# Patient Record
Sex: Male | Born: 1950 | Race: White | Hispanic: No | Marital: Single
Health system: Southern US, Community
[De-identification: ages and names within clinical notes are randomized; demographics above are authoritative.]

---

## 2009-12-23 ENCOUNTER — Ambulatory Visit: Payer: Self-pay | Admitting: Infectious Diseases

## 2009-12-23 ENCOUNTER — Inpatient Hospital Stay (HOSPITAL_COMMUNITY): Admission: EM | Admit: 2009-12-23 | Discharge: 2009-12-30 | Payer: Self-pay | Admitting: Emergency Medicine

## 2009-12-27 ENCOUNTER — Ambulatory Visit: Payer: Self-pay | Admitting: Vascular Surgery

## 2009-12-27 ENCOUNTER — Ambulatory Visit: Payer: Self-pay | Admitting: Physical Medicine & Rehabilitation

## 2009-12-27 ENCOUNTER — Encounter: Payer: Self-pay | Admitting: Internal Medicine

## 2009-12-27 DIAGNOSIS — I10 Essential (primary) hypertension: Secondary | ICD-10-CM | POA: Insufficient documentation

## 2009-12-27 DIAGNOSIS — E119 Type 2 diabetes mellitus without complications: Secondary | ICD-10-CM | POA: Insufficient documentation

## 2009-12-27 DIAGNOSIS — E785 Hyperlipidemia, unspecified: Secondary | ICD-10-CM | POA: Insufficient documentation

## 2009-12-27 DIAGNOSIS — I6350 Cerebral infarction due to unspecified occlusion or stenosis of unspecified cerebral artery: Secondary | ICD-10-CM | POA: Insufficient documentation

## 2009-12-30 ENCOUNTER — Ambulatory Visit: Payer: Self-pay | Admitting: Physical Medicine & Rehabilitation

## 2009-12-30 ENCOUNTER — Inpatient Hospital Stay (HOSPITAL_COMMUNITY)
Admission: RE | Admit: 2009-12-30 | Discharge: 2010-01-10 | Payer: Self-pay | Admitting: Physical Medicine & Rehabilitation

## 2010-01-23 ENCOUNTER — Encounter (INDEPENDENT_AMBULATORY_CARE_PROVIDER_SITE_OTHER): Payer: Self-pay | Admitting: *Deleted

## 2010-01-23 LAB — CONVERTED CEMR LAB
Cholesterol: 84 mg/dL (ref 0–200)
LDL Cholesterol: 19 mg/dL (ref 0–99)
Total CHOL/HDL Ratio: 2.6
Triglycerides: 164 mg/dL — ABNORMAL HIGH (ref <150)
VLDL: 33 mg/dL (ref 0–40)

## 2010-01-30 ENCOUNTER — Encounter
Admission: RE | Admit: 2010-01-30 | Discharge: 2010-02-03 | Payer: Self-pay | Source: Home / Self Care | Attending: Physical Medicine & Rehabilitation | Admitting: Physical Medicine & Rehabilitation

## 2010-02-03 ENCOUNTER — Ambulatory Visit: Payer: Self-pay | Admitting: Physical Medicine & Rehabilitation

## 2010-02-20 ENCOUNTER — Emergency Department (HOSPITAL_COMMUNITY)
Admission: EM | Admit: 2010-02-20 | Discharge: 2010-02-20 | Payer: Self-pay | Source: Home / Self Care | Admitting: Emergency Medicine

## 2010-04-04 ENCOUNTER — Ambulatory Visit: Payer: Self-pay | Admitting: Physical Medicine & Rehabilitation

## 2010-05-17 ENCOUNTER — Encounter (INDEPENDENT_AMBULATORY_CARE_PROVIDER_SITE_OTHER): Payer: Self-pay | Admitting: *Deleted

## 2010-05-17 LAB — CONVERTED CEMR LAB
ALT: 12 units/L (ref 0–53)
AST: 17 units/L (ref 0–37)
Albumin: 4 g/dL (ref 3.5–5.2)
Alkaline Phosphatase: 71 units/L (ref 39–117)
Basophils Relative: 1 % (ref 0–1)
Chloride: 109 meq/L (ref 96–112)
Eosinophils Absolute: 0.6 10*3/uL (ref 0.0–0.7)
MCHC: 32.4 g/dL (ref 30.0–36.0)
MCV: 90.9 fL (ref 78.0–100.0)
Neutro Abs: 6.8 10*3/uL (ref 1.7–7.7)
Neutrophils Relative %: 55 % (ref 43–77)
Platelets: 248 10*3/uL (ref 150–400)
Potassium: 3.8 meq/L (ref 3.5–5.3)
Sodium: 141 meq/L (ref 135–145)
Total Protein: 7.4 g/dL (ref 6.0–8.3)
WBC: 12.4 10*3/uL — ABNORMAL HIGH (ref 4.0–10.5)

## 2010-05-23 NOTE — Discharge Summary (Signed)
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) - Signed Added new problem of HYPERTENSION (ICD-401.9) - Signed Added new problem of HYPERLIPIDEMIA (ICD-272.4) - Signed Added new problem of DIABETES MELLITUS, TYPE II (ICD-250.00) - Signed  Medication list changes:  Added new medication of ASPIRIN 81 MG TBEC (ASPIRIN) Take 1 tablet by mouth once a day - Signed Added new medication of PLAVIX 75 MG TABS (CLOPIDOGREL BISULFATE) Take 1 tablet by mouth once a day - Signed Added new medication of LISINOPRIL-HYDROCHLOROTHIAZIDE 20-25 MG TABS (LISINOPRIL-HYDROCHLOROTHIAZIDE) Take 1 tablet by mouth once a day - Signed Added new medication of PRAVASTATIN SODIUM 40 MG TABS (PRAVASTATIN SODIUM) Take 1 tablet by mouth once a day - Signed Added new medication of NORVASC 10 MG TABS (AMLODIPINE BESYLATE) Take 1 tablet by mouth once a day - Signed Added new medication of METFORMIN HCL 500 MG TABS (METFORMIN HCL) Take 1 tablet by mouth once a day for a week then increase it to  1 tablet by mouth two times a day - Signed  The medication, problem, and allergy lists have been updated.  Please see the dictated discharge summary for details.  Discharge medications:  ASPIRIN 81 MG TBEC (ASPIRIN) Take 1 tablet by mouth once a day PLAVIX 75 MG TABS (CLOPIDOGREL BISULFATE) Take 1 tablet by mouth once a day LISINOPRIL-HYDROCHLOROTHIAZIDE 20-25 MG TABS (LISINOPRIL-HYDROCHLOROTHIAZIDE) Take 1 tablet by mouth once a day PRAVASTATIN SODIUM 40 MG TABS  (PRAVASTATIN SODIUM) Take 1 tablet by mouth once a day NORVASC 10 MG TABS (AMLODIPINE BESYLATE) Take 1 tablet by mouth once a day METFORMIN HCL 500 MG TABS (METFORMIN HCL) Take 1 tablet by mouth once a day for a week then increase it to  1 tablet by mouth two times a day  Other patient instructions:  Follow up with [facility] on 01/23/10 at 8:30 pm. Call [phone] if ypu have a question or concerns.

## 2010-06-20 ENCOUNTER — Encounter (INDEPENDENT_AMBULATORY_CARE_PROVIDER_SITE_OTHER): Payer: Self-pay | Admitting: *Deleted

## 2010-06-20 LAB — CONVERTED CEMR LAB
BUN: 27 mg/dL — ABNORMAL HIGH (ref 6–23)
Calcium: 9.1 mg/dL (ref 8.4–10.5)
Creatinine, Ser: 1.31 mg/dL (ref 0.40–1.50)
Glucose, Bld: 145 mg/dL — ABNORMAL HIGH (ref 70–99)

## 2010-07-05 LAB — DIFFERENTIAL
Basophils Relative: 1 % (ref 0–1)
Lymphocytes Relative: 20 % (ref 12–46)
Monocytes Absolute: 1.2 10*3/uL — ABNORMAL HIGH (ref 0.1–1.0)
Monocytes Relative: 9 % (ref 3–12)
Neutro Abs: 9.3 10*3/uL — ABNORMAL HIGH (ref 1.7–7.7)

## 2010-07-05 LAB — COMPREHENSIVE METABOLIC PANEL
Albumin: 3.3 g/dL — ABNORMAL LOW (ref 3.5–5.2)
Alkaline Phosphatase: 68 U/L (ref 39–117)
BUN: 15 mg/dL (ref 6–23)
GFR calc Af Amer: >60 mL/min (ref >60)
Potassium: 3.2 mEq/L — ABNORMAL LOW (ref 3.5–5.1)
Total Protein: 6.9 g/dL (ref 6.0–8.3)

## 2010-07-05 LAB — CBC
MCV: 80.3 fL (ref 78.0–100.0)
Platelets: 215 10*3/uL (ref 150–400)
RDW: 12.9 % (ref 11.5–15.5)
WBC: 13.5 10*3/uL — ABNORMAL HIGH (ref 4.0–10.5)

## 2010-07-05 LAB — POCT I-STAT, CHEM 8
BUN: 16 mg/dL (ref 6–23)
Calcium, Ion: 1.06 mmol/L — ABNORMAL LOW (ref 1.12–1.32)
Chloride: 95 mEq/L — ABNORMAL LOW (ref 96–112)
Glucose, Bld: 272 mg/dL — ABNORMAL HIGH (ref 70–99)

## 2010-07-06 LAB — URINALYSIS, ROUTINE W REFLEX MICROSCOPIC
Bilirubin Urine: NEGATIVE
Leukocytes, UA: NEGATIVE
Nitrite: NEGATIVE
Specific Gravity, Urine: 1.025 (ref 1.005–1.030)
Urobilinogen, UA: 0.2 mg/dL (ref 0.0–1.0)
pH: 7 (ref 5.0–8.0)

## 2010-07-06 LAB — GLUCOSE, CAPILLARY
Glucose-Capillary: 110 mg/dL — ABNORMAL HIGH (ref 70–99)
Glucose-Capillary: 112 mg/dL — ABNORMAL HIGH (ref 70–99)
Glucose-Capillary: 117 mg/dL — ABNORMAL HIGH (ref 70–99)
Glucose-Capillary: 123 mg/dL — ABNORMAL HIGH (ref 70–99)
Glucose-Capillary: 158 mg/dL — ABNORMAL HIGH (ref 70–99)
Glucose-Capillary: 171 mg/dL — ABNORMAL HIGH (ref 70–99)
Glucose-Capillary: 174 mg/dL — ABNORMAL HIGH (ref 70–99)
Glucose-Capillary: 176 mg/dL — ABNORMAL HIGH (ref 70–99)
Glucose-Capillary: 187 mg/dL — ABNORMAL HIGH (ref 70–99)
Glucose-Capillary: 218 mg/dL — ABNORMAL HIGH (ref 70–99)
Glucose-Capillary: 140 mg/dL — ABNORMAL HIGH (ref 70–99)
Glucose-Capillary: 142 mg/dL — ABNORMAL HIGH (ref 70–99)
Glucose-Capillary: 143 mg/dL — ABNORMAL HIGH (ref 70–99)
Glucose-Capillary: 155 mg/dL — ABNORMAL HIGH (ref 70–99)
Glucose-Capillary: 170 mg/dL — ABNORMAL HIGH (ref 70–99)
Glucose-Capillary: 172 mg/dL — ABNORMAL HIGH (ref 70–99)
Glucose-Capillary: 174 mg/dL — ABNORMAL HIGH (ref 70–99)
Glucose-Capillary: 178 mg/dL — ABNORMAL HIGH (ref 70–99)
Glucose-Capillary: 190 mg/dL — ABNORMAL HIGH (ref 70–99)
Glucose-Capillary: 196 mg/dL — ABNORMAL HIGH (ref 70–99)
Glucose-Capillary: 203 mg/dL — ABNORMAL HIGH (ref 70–99)
Glucose-Capillary: 205 mg/dL — ABNORMAL HIGH (ref 70–99)
Glucose-Capillary: 210 mg/dL — ABNORMAL HIGH (ref 70–99)
Glucose-Capillary: 222 mg/dL — ABNORMAL HIGH (ref 70–99)
Glucose-Capillary: 228 mg/dL — ABNORMAL HIGH (ref 70–99)
Glucose-Capillary: 242 mg/dL — ABNORMAL HIGH (ref 70–99)
Glucose-Capillary: 261 mg/dL — ABNORMAL HIGH (ref 70–99)
Glucose-Capillary: 270 mg/dL — ABNORMAL HIGH (ref 70–99)
Glucose-Capillary: 272 mg/dL — ABNORMAL HIGH (ref 70–99)
Glucose-Capillary: 337 mg/dL — ABNORMAL HIGH (ref 70–99)

## 2010-07-06 LAB — CBC
HCT: 36.1 % — ABNORMAL LOW (ref 39.0–52.0)
Hemoglobin: 12.5 g/dL — ABNORMAL LOW (ref 13.0–17.0)
Hemoglobin: 12.8 g/dL — ABNORMAL LOW (ref 13.0–17.0)
Hemoglobin: 13.3 g/dL (ref 13.0–17.0)
Hemoglobin: 14.3 g/dL (ref 13.0–17.0)
Hemoglobin: 14.4 g/dL (ref 13.0–17.0)
MCH: 28.8 pg (ref 26.0–34.0)
MCH: 29.3 pg (ref 26.0–34.0)
MCH: 29.3 pg (ref 26.0–34.0)
MCH: 29.8 pg (ref 26.0–34.0)
MCH: 29.8 pg (ref 26.0–34.0)
MCHC: 35.1 g/dL (ref 30.0–36.0)
MCHC: 35.1 g/dL (ref 30.0–36.0)
MCHC: 35.2 g/dL (ref 30.0–36.0)
MCHC: 35.8 g/dL (ref 30.0–36.0)
MCV: 83.1 fL (ref 78.0–100.0)
MCV: 83.2 fL (ref 78.0–100.0)
MCV: 83.6 fL (ref 78.0–100.0)
MCV: 83.6 fL (ref 78.0–100.0)
MCV: 84.3 fL (ref 78.0–100.0)
Platelets: 172 10*3/uL (ref 150–400)
Platelets: 177 10*3/uL (ref 150–400)
Platelets: 180 10*3/uL (ref 150–400)
Platelets: 190 10*3/uL (ref 150–400)
Platelets: 194 10*3/uL (ref 150–400)
RBC: 4.25 MIL/uL (ref 4.22–5.81)
RBC: 4.45 MIL/uL (ref 4.22–5.81)
RBC: 4.46 MIL/uL (ref 4.22–5.81)
RBC: 4.83 MIL/uL (ref 4.22–5.81)
RDW: 12.9 % (ref 11.5–15.5)
RDW: 13.1 % (ref 11.5–15.5)
RDW: 13.2 % (ref 11.5–15.5)
WBC: 11.4 10*3/uL — ABNORMAL HIGH (ref 4.0–10.5)
WBC: 12 10*3/uL — ABNORMAL HIGH (ref 4.0–10.5)
WBC: 9.6 10*3/uL (ref 4.0–10.5)

## 2010-07-06 LAB — LIPID PANEL
HDL: 38 mg/dL — ABNORMAL LOW (ref >39)
HDL: 46 mg/dL (ref >39)
LDL Cholesterol: 105 mg/dL — ABNORMAL HIGH (ref 0–99)
Triglycerides: 332 mg/dL — ABNORMAL HIGH (ref <150)

## 2010-07-06 LAB — BASIC METABOLIC PANEL
BUN: 11 mg/dL (ref 6–23)
CO2: 25 mEq/L (ref 19–32)
CO2: 25 mEq/L (ref 19–32)
CO2: 25 mEq/L (ref 19–32)
CO2: 27 mEq/L (ref 19–32)
Calcium: 8.3 mg/dL — ABNORMAL LOW (ref 8.4–10.5)
Calcium: 8.3 mg/dL — ABNORMAL LOW (ref 8.4–10.5)
Calcium: 9.1 mg/dL (ref 8.4–10.5)
Chloride: 105 mEq/L (ref 96–112)
Chloride: 107 mEq/L (ref 96–112)
Chloride: 107 mEq/L (ref 96–112)
Creatinine, Ser: 1.06 mg/dL (ref 0.4–1.5)
Creatinine, Ser: 1.06 mg/dL (ref 0.4–1.5)
Creatinine, Ser: 1.12 mg/dL (ref 0.4–1.5)
Creatinine, Ser: 1.21 mg/dL (ref 0.4–1.5)
GFR calc Af Amer: >60 mL/min (ref >60)
GFR calc Af Amer: >60 mL/min (ref >60)
GFR calc Af Amer: >60 mL/min (ref >60)
GFR calc Af Amer: >60 mL/min (ref >60)
GFR calc non Af Amer: 59 mL/min — ABNORMAL LOW (ref >60)
GFR calc non Af Amer: >60 mL/min (ref >60)
Glucose, Bld: 154 mg/dL — ABNORMAL HIGH (ref 70–99)
Glucose, Bld: 173 mg/dL — ABNORMAL HIGH (ref 70–99)
Potassium: 3.8 mEq/L (ref 3.5–5.1)
Sodium: 137 mEq/L (ref 135–145)
Sodium: 139 mEq/L (ref 135–145)
Sodium: 139 mEq/L (ref 135–145)

## 2010-07-06 LAB — PROTIME-INR
INR: 0.9 (ref 0.00–1.49)
Prothrombin Time: 12.4 seconds (ref 11.6–15.2)
Prothrombin Time: 13.4 seconds (ref 11.6–15.2)

## 2010-07-06 LAB — CARDIAC PANEL(CRET KIN+CKTOT+MB+TROPI)
CK, MB: 1.8 ng/mL (ref 0.3–4.0)
Relative Index: INVALID (ref 0.0–2.5)
Total CK: 104 U/L (ref 7–232)
Total CK: 72 U/L (ref 7–232)
Total CK: 81 U/L (ref 7–232)
Troponin I: 0.04 ng/mL (ref 0.00–0.06)

## 2010-07-06 LAB — DIFFERENTIAL
Basophils Absolute: 0.1 10*3/uL (ref 0.0–0.1)
Basophils Relative: 1 % (ref 0–1)
Basophils Relative: 1 % (ref 0–1)
Eosinophils Absolute: 0 10*3/uL (ref 0.0–0.7)
Eosinophils Absolute: 0.5 10*3/uL (ref 0.0–0.7)
Eosinophils Relative: 0 % (ref 0–5)
Eosinophils Relative: 4 % (ref 0–5)
Lymphs Abs: 1.5 10*3/uL (ref 0.7–4.0)
Monocytes Absolute: 1.4 10*3/uL — ABNORMAL HIGH (ref 0.1–1.0)
Monocytes Relative: 13 % — ABNORMAL HIGH (ref 3–12)
Neutrophils Relative %: 50 % (ref 43–77)

## 2010-07-06 LAB — CK TOTAL AND CKMB (NOT AT ARMC)
Relative Index: INVALID (ref 0.0–2.5)
Total CK: 91 U/L (ref 7–232)

## 2010-07-06 LAB — COMPREHENSIVE METABOLIC PANEL
ALT: 17 U/L (ref 0–53)
ALT: 19 U/L (ref 0–53)
AST: 24 U/L (ref 0–37)
Albumin: 2.9 g/dL — ABNORMAL LOW (ref 3.5–5.2)
Albumin: 3.1 g/dL — ABNORMAL LOW (ref 3.5–5.2)
Alkaline Phosphatase: 62 U/L (ref 39–117)
Alkaline Phosphatase: 62 U/L (ref 39–117)
BUN: 13 mg/dL (ref 6–23)
CO2: 26 mEq/L (ref 19–32)
Calcium: 8.6 mg/dL (ref 8.4–10.5)
Calcium: 8.6 mg/dL (ref 8.4–10.5)
Chloride: 98 mEq/L (ref 96–112)
Creatinine, Ser: 1.07 mg/dL (ref 0.4–1.5)
GFR calc Af Amer: >60 mL/min (ref >60)
GFR calc non Af Amer: 43 mL/min — ABNORMAL LOW (ref >60)
Glucose, Bld: 162 mg/dL — ABNORMAL HIGH (ref 70–99)
Potassium: 3.3 mEq/L — ABNORMAL LOW (ref 3.5–5.1)
Potassium: 3.7 mEq/L (ref 3.5–5.1)
Sodium: 131 mEq/L — ABNORMAL LOW (ref 135–145)
Sodium: 135 mEq/L (ref 135–145)
Sodium: 139 mEq/L (ref 135–145)
Total Bilirubin: 0.4 mg/dL (ref 0.3–1.2)
Total Protein: 6.3 g/dL (ref 6.0–8.3)

## 2010-07-06 LAB — RAPID URINE DRUG SCREEN, HOSP PERFORMED
Benzodiazepines: NOT DETECTED
Cocaine: NOT DETECTED
Tetrahydrocannabinol: NOT DETECTED

## 2010-07-06 LAB — HEMOGLOBIN A1C
Hgb A1c MFr Bld: 9.9 % — ABNORMAL HIGH (ref <5.7)
Mean Plasma Glucose: 237 mg/dL — ABNORMAL HIGH (ref <117)

## 2010-07-06 LAB — POCT CARDIAC MARKERS
CKMB, poc: 1.3 ng/mL (ref 1.0–8.0)
Myoglobin, poc: 84.3 ng/mL (ref 12–200)

## 2010-07-06 LAB — MAGNESIUM: Magnesium: 1.9 mg/dL (ref 1.5–2.5)

## 2010-07-06 LAB — TSH: TSH: 1.089 u[IU]/mL (ref 0.350–4.500)

## 2010-07-06 LAB — URINE MICROSCOPIC-ADD ON

## 2010-09-20 ENCOUNTER — Other Ambulatory Visit (HOSPITAL_COMMUNITY): Payer: Self-pay | Admitting: Family Medicine

## 2010-09-20 ENCOUNTER — Other Ambulatory Visit: Payer: Self-pay | Admitting: Internal Medicine

## 2010-09-20 DIAGNOSIS — M75100 Unspecified rotator cuff tear or rupture of unspecified shoulder, not specified as traumatic: Secondary | ICD-10-CM

## 2010-09-22 ENCOUNTER — Ambulatory Visit (HOSPITAL_COMMUNITY)
Admission: RE | Admit: 2010-09-22 | Discharge: 2010-09-22 | Disposition: A | Discharge status: Home / Self Care | Payer: Self-pay | Source: Ambulatory Visit | Attending: Family Medicine | Admitting: Family Medicine

## 2010-09-22 DIAGNOSIS — M719 Bursopathy, unspecified: Secondary | ICD-10-CM | POA: Insufficient documentation

## 2010-09-22 DIAGNOSIS — M67919 Unspecified disorder of synovium and tendon, unspecified shoulder: Secondary | ICD-10-CM | POA: Insufficient documentation

## 2010-09-22 DIAGNOSIS — M25519 Pain in unspecified shoulder: Secondary | ICD-10-CM | POA: Insufficient documentation

## 2010-09-22 DIAGNOSIS — M19019 Primary osteoarthritis, unspecified shoulder: Secondary | ICD-10-CM | POA: Insufficient documentation

## 2010-09-22 DIAGNOSIS — M75100 Unspecified rotator cuff tear or rupture of unspecified shoulder, not specified as traumatic: Secondary | ICD-10-CM

## 2011-04-26 ENCOUNTER — Emergency Department: Payer: Self-pay | Admitting: Emergency Medicine

## 2011-04-26 LAB — CBC
HGB: 13.5 g/dL (ref 13.0–18.0)
MCH: 29.8 pg (ref 26.0–34.0)
MCHC: 34.6 g/dL (ref 32.0–36.0)
Platelet: 248 10*3/uL (ref 150–440)
RBC: 4.54 10*6/uL (ref 4.40–5.90)
RDW: 12.4 % (ref 11.5–14.5)

## 2011-04-26 LAB — COMPREHENSIVE METABOLIC PANEL
Albumin: 3.4 g/dL (ref 3.4–5.0)
Alkaline Phosphatase: 76 U/L (ref 50–136)
Calcium, Total: 9.3 mg/dL (ref 8.5–10.1)
Co2: 28 mmol/L (ref 21–32)
Creatinine: 1.47 mg/dL — ABNORMAL HIGH (ref 0.60–1.30)
EGFR (Non-African Amer.): 52 — ABNORMAL LOW
Glucose: 209 mg/dL — ABNORMAL HIGH (ref 65–99)
Potassium: 3.3 mmol/L — ABNORMAL LOW (ref 3.5–5.1)
Sodium: 145 mmol/L (ref 136–145)

## 2011-04-26 LAB — LIPASE, BLOOD: Lipase: 85 U/L (ref 73–393)

## 2011-10-20 IMAGING — CT CT HEAD W/O CM
1 series · 16 of 30 positions shown, 20 images · non-contrast
Comparison: None.

CLINICAL DATA: Right-sided weakness and diaphoresis.

CT HEAD WITHOUT CONTRAST
TECHNIQUE: Contiguous axial images were obtained from the base of
the skull through the vertex without contrast.

[Series 2: head routine 4.8 h37s · axial · 0.47mm/px · z∈[-107,+51]mm · 16 of 36 slices shown, 20 images]
[im 2/36  brain]
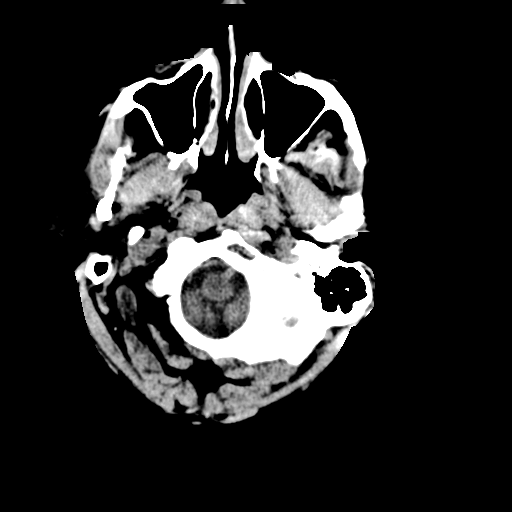
[im 2/36  bone]
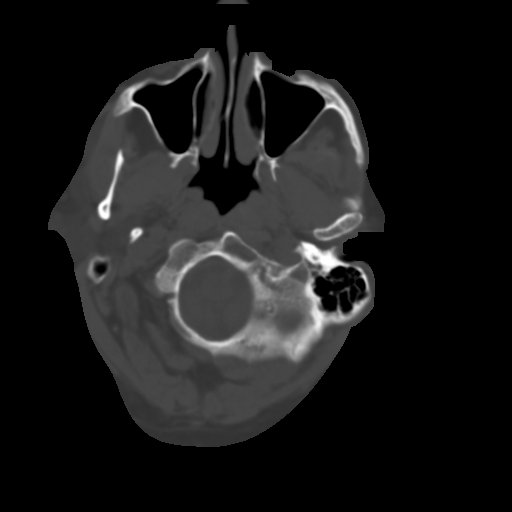
[im 4/36  brain]
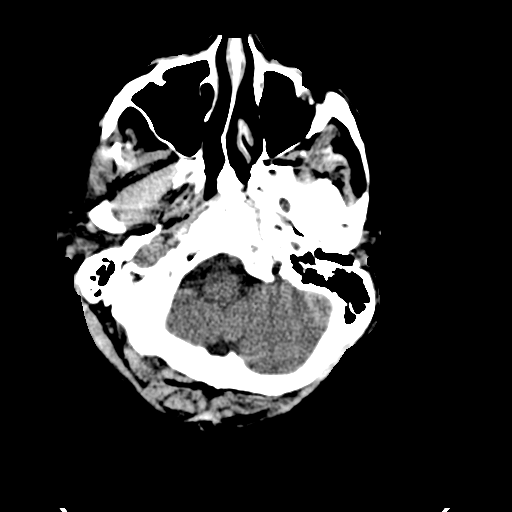
[im 7/36  brain]
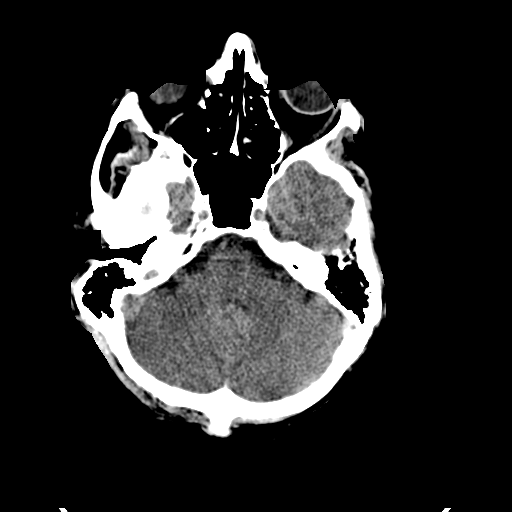
[im 9/36  brain]
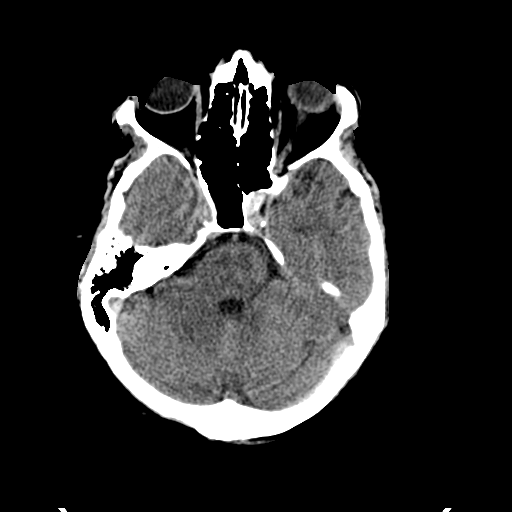
[im 10/36  brain]
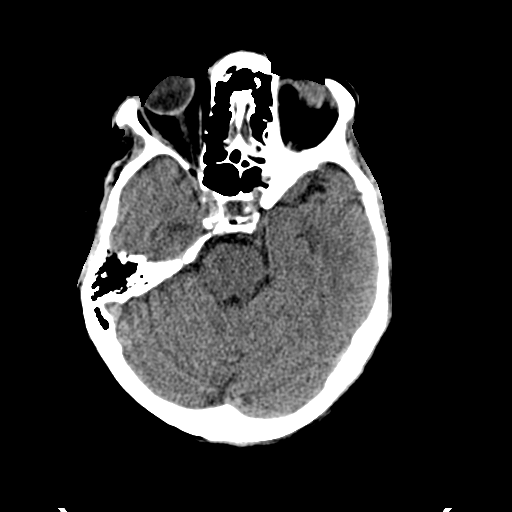
[im 10/36  bone]
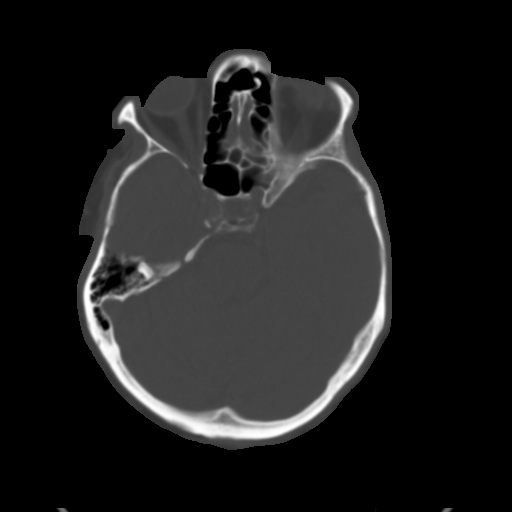
[im 13/36  brain]
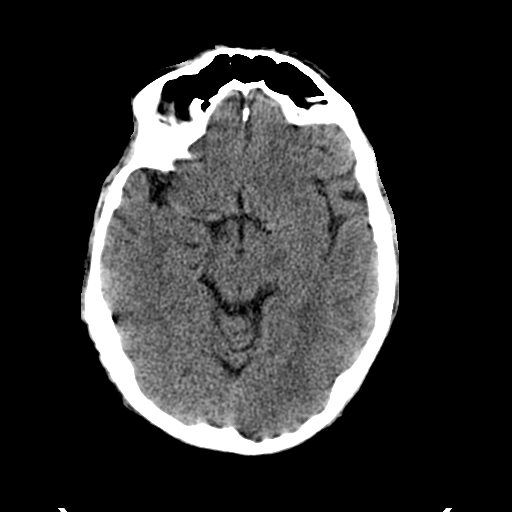
[im 15/36  brain]
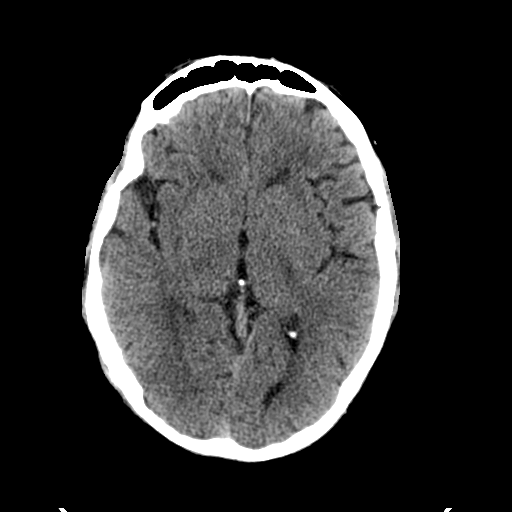
[im 17/36  brain]
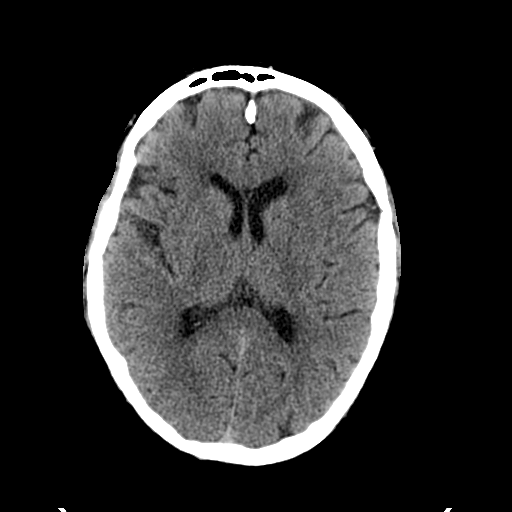
[im 19/36  brain]
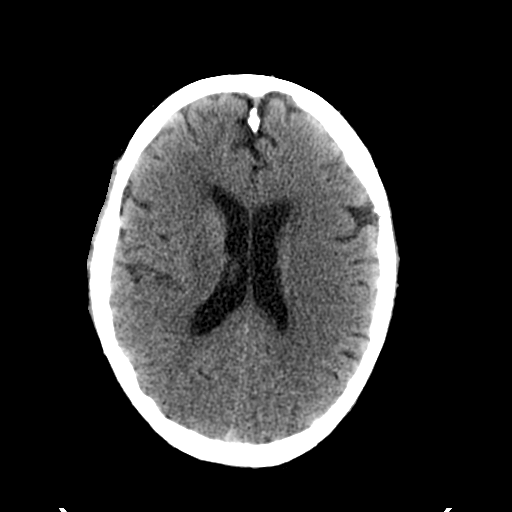
[im 19/36  bone]
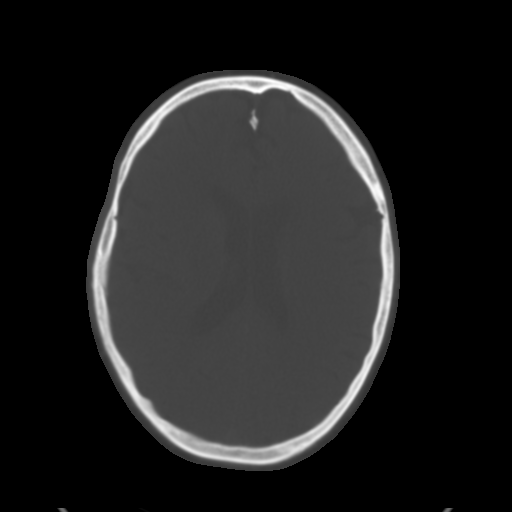
[im 21/36  brain]
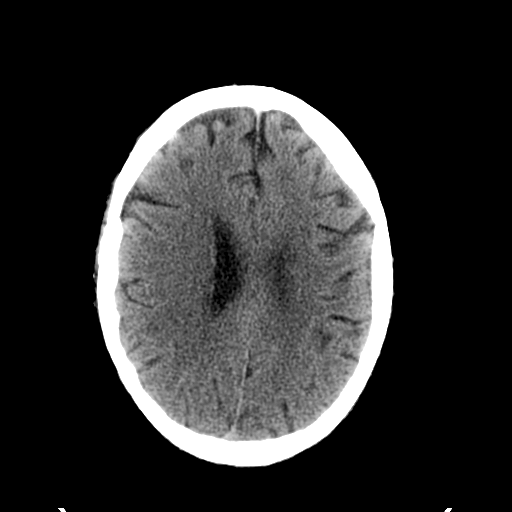
[im 23/36  brain]
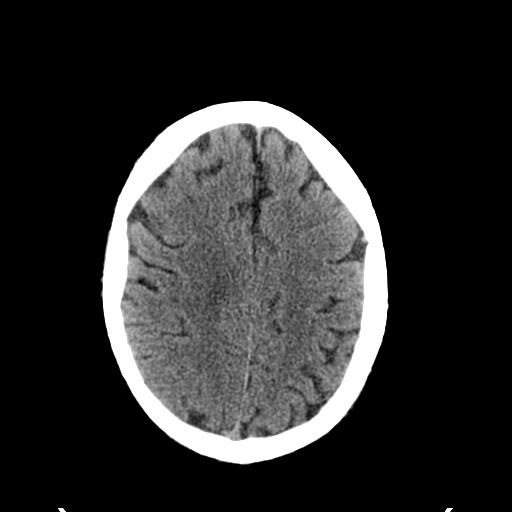
[im 26/36  brain]
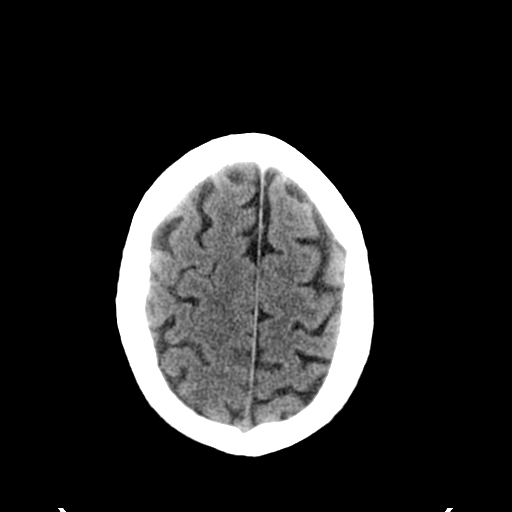
[im 27/36  brain]
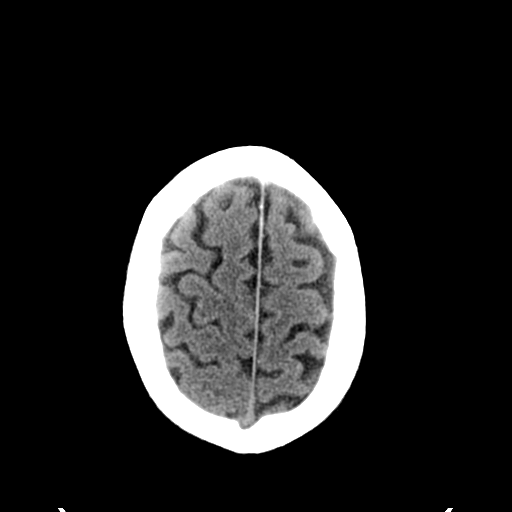
[im 27/36  bone]
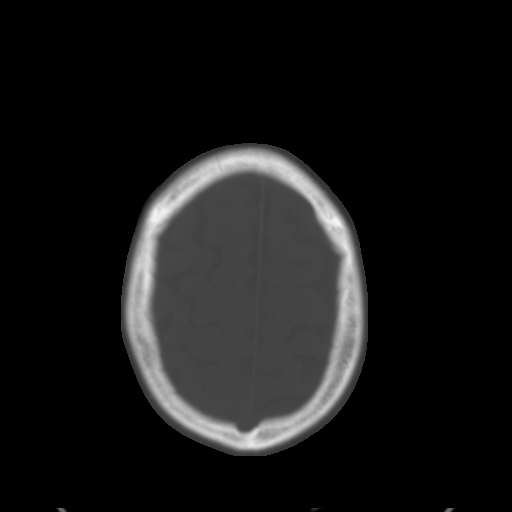
[im 29/36  brain]
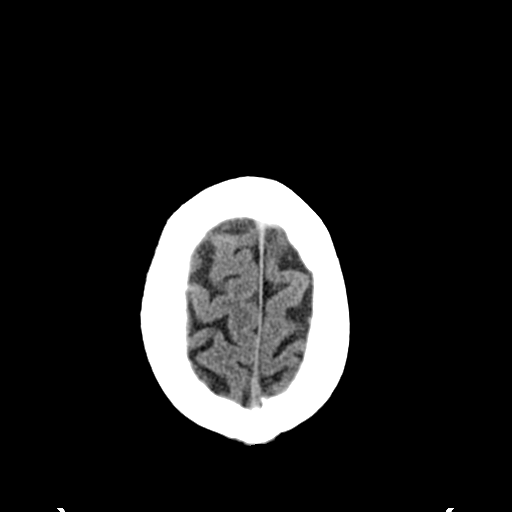
[im 32/36  brain]
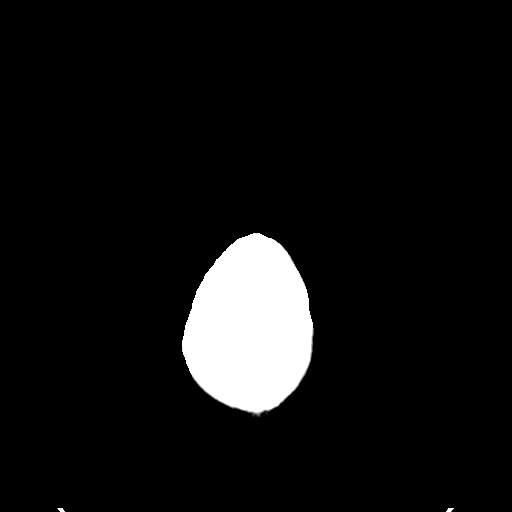
[im 34/36  brain]
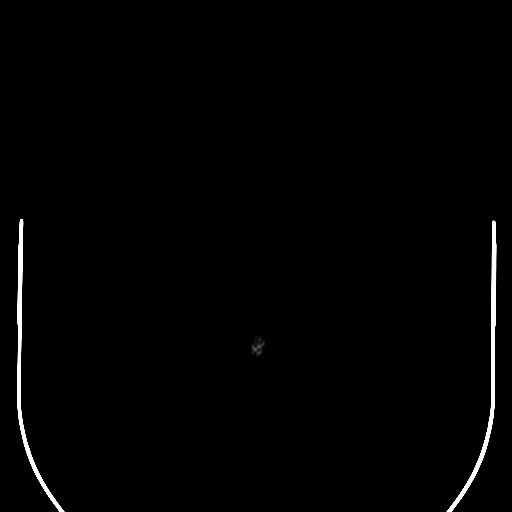

[16 of 30 positions shown; findings below may reference images not displayed]

FINDINGS: There are no midline shifts or mass lesions.  The
ventricles are normal in size and contour.  There is mild cerebral
atrophy.  There is no CT scan evidence for acute hemorrhage or
stroke.  There are no extra-axial fluid collections.  Bone windows
settings are negative.
IMPRESSION: Mild cerebral atrophy.  No acute intracranial findings.

## 2012-02-15 ENCOUNTER — Inpatient Hospital Stay: Payer: Self-pay | Admitting: Specialist

## 2012-02-15 LAB — CK TOTAL AND CKMB (NOT AT ARMC)
CK, Total: 89 U/L (ref 35–232)
CK-MB: 1.3 ng/mL (ref 0.5–3.6)
CK-MB: 1.3 ng/mL (ref 0.5–3.6)

## 2012-02-15 LAB — TROPONIN I
Troponin-I: 0.08 ng/mL — ABNORMAL HIGH
Troponin-I: 0.07 ng/mL — ABNORMAL HIGH

## 2012-02-15 LAB — CBC
HGB: 15.4 g/dL (ref 13.0–18.0)
MCHC: 34.4 g/dL (ref 32.0–36.0)
Platelet: 259 10*3/uL (ref 150–440)
RDW: 13.5 % (ref 11.5–14.5)

## 2012-02-15 LAB — COMPREHENSIVE METABOLIC PANEL
Anion Gap: 14 (ref 7–16)
BUN: 28 mg/dL — ABNORMAL HIGH (ref 7–18)
Bilirubin,Total: 0.9 mg/dL (ref 0.2–1.0)
Chloride: 95 mmol/L — ABNORMAL LOW (ref 98–107)
Co2: 23 mmol/L (ref 21–32)
Creatinine: 1.94 mg/dL — ABNORMAL HIGH (ref 0.60–1.30)
EGFR (African American): 43 — ABNORMAL LOW
EGFR (Non-African Amer.): 37 — ABNORMAL LOW
Potassium: 3.1 mmol/L — ABNORMAL LOW (ref 3.5–5.1)
SGOT(AST): 27 U/L (ref 15–37)
SGPT (ALT): 19 U/L (ref 12–78)
Total Protein: 8 g/dL (ref 6.4–8.2)

## 2012-02-15 LAB — LIPID PANEL
Cholesterol: 317 mg/dL — ABNORMAL HIGH (ref 0–200)
HDL Cholesterol: 41 mg/dL (ref 40–60)
Ldl Cholesterol, Calc: 206 mg/dL — ABNORMAL HIGH (ref 0–100)
Triglycerides: 349 mg/dL — ABNORMAL HIGH (ref 0–200)
VLDL Cholesterol, Calc: 70 mg/dL — ABNORMAL HIGH (ref 5–40)

## 2012-02-15 LAB — HEMOGLOBIN A1C: Hemoglobin A1C: 12.3 % — ABNORMAL HIGH (ref 4.2–6.3)

## 2012-02-16 LAB — CBC WITH DIFFERENTIAL/PLATELET
Basophil #: 0.1 10*3/uL (ref 0.0–0.1)
Basophil %: 0.5 %
Eosinophil #: 0.1 10*3/uL (ref 0.0–0.7)
HCT: 35.3 % — ABNORMAL LOW (ref 40.0–52.0)
HGB: 12.3 g/dL — ABNORMAL LOW (ref 13.0–18.0)
Lymphocyte %: 15.8 %
MCH: 28.8 pg (ref 26.0–34.0)
MCV: 83 fL (ref 80–100)
Monocyte #: 1.3 x10 3/mm — ABNORMAL HIGH (ref 0.2–1.0)
Monocyte %: 9.1 %
Neutrophil #: 10.5 10*3/uL — ABNORMAL HIGH (ref 1.4–6.5)
Neutrophil %: 74.1 %
RBC: 4.27 10*6/uL — ABNORMAL LOW (ref 4.40–5.90)
RDW: 13.6 % (ref 11.5–14.5)

## 2012-02-16 LAB — URINALYSIS, COMPLETE
Glucose,UR: >=500 mg/dL (ref 0–75)
Granular Cast: 3
Nitrite: NEGATIVE
Protein: >=500
Specific Gravity: 1.02 (ref 1.003–1.030)

## 2012-02-16 LAB — BASIC METABOLIC PANEL
Anion Gap: 13 (ref 7–16)
Co2: 23 mmol/L (ref 21–32)
Creatinine: 2.34 mg/dL — ABNORMAL HIGH (ref 0.60–1.30)
EGFR (African American): 34 — ABNORMAL LOW
EGFR (Non-African Amer.): 30 — ABNORMAL LOW
Sodium: 136 mmol/L (ref 136–145)

## 2012-02-16 LAB — CK TOTAL AND CKMB (NOT AT ARMC)
CK, Total: 76 U/L (ref 35–232)
CK-MB: 1.3 ng/mL (ref 0.5–3.6)

## 2012-02-17 DIAGNOSIS — I059 Rheumatic mitral valve disease, unspecified: Secondary | ICD-10-CM

## 2012-02-17 LAB — CBC WITH DIFFERENTIAL/PLATELET
Basophil %: 0.4 %
Eosinophil #: 0.4 10*3/uL (ref 0.0–0.7)
Eosinophil %: 3.3 %
HCT: 31.9 % — ABNORMAL LOW (ref 40.0–52.0)
HGB: 11.1 g/dL — ABNORMAL LOW (ref 13.0–18.0)
Lymphocyte #: 3 10*3/uL (ref 1.0–3.6)
MCH: 28.8 pg (ref 26.0–34.0)
MCV: 83 fL (ref 80–100)
Monocyte #: 1.2 x10 3/mm — ABNORMAL HIGH (ref 0.2–1.0)
Monocyte %: 9.6 %
Neutrophil %: 63.9 %
Platelet: 188 10*3/uL (ref 150–440)
RBC: 3.86 10*6/uL — ABNORMAL LOW (ref 4.40–5.90)

## 2012-02-17 LAB — PHOSPHORUS: Phosphorus: 2.9 mg/dL (ref 2.5–4.9)

## 2012-02-17 LAB — BASIC METABOLIC PANEL
Anion Gap: 10 (ref 7–16)
Chloride: 105 mmol/L (ref 98–107)
Co2: 23 mmol/L (ref 21–32)
Creatinine: 2.46 mg/dL — ABNORMAL HIGH (ref 0.60–1.30)
EGFR (Non-African Amer.): 28 — ABNORMAL LOW
Glucose: 159 mg/dL — ABNORMAL HIGH (ref 65–99)
Osmolality: 287 (ref 275–301)
Potassium: 3.3 mmol/L — ABNORMAL LOW (ref 3.5–5.1)
Sodium: 138 mmol/L (ref 136–145)

## 2012-02-17 LAB — IRON AND TIBC
Iron Bind.Cap.(Total): 190 ug/dL — ABNORMAL LOW (ref 250–450)
Iron: 32 ug/dL — ABNORMAL LOW (ref 65–175)

## 2012-02-17 LAB — PROTEIN, URINE, RANDOM: Protein, Random Urine: 152 mg/dL — ABNORMAL HIGH (ref 0–12)

## 2012-02-17 LAB — FERRITIN: Ferritin (ARMC): 514 ng/mL — ABNORMAL HIGH (ref 8–388)

## 2012-02-17 LAB — CREATININE, URINE, RANDOM: Creatinine, Urine Random: 60.5 mg/dL (ref 30.0–125.0)

## 2012-02-18 LAB — BASIC METABOLIC PANEL
Anion Gap: 8 (ref 7–16)
BUN: 33 mg/dL — ABNORMAL HIGH (ref 7–18)
Chloride: 107 mmol/L (ref 98–107)
Co2: 23 mmol/L (ref 21–32)
Creatinine: 2.06 mg/dL — ABNORMAL HIGH (ref 0.60–1.30)
EGFR (African American): 40 — ABNORMAL LOW
Potassium: 3.8 mmol/L (ref 3.5–5.1)
Sodium: 138 mmol/L (ref 136–145)

## 2012-02-19 LAB — UR PROT ELECTROPHORESIS, URINE RANDOM

## 2012-02-19 LAB — BASIC METABOLIC PANEL
Anion Gap: 9 (ref 7–16)
BUN: 30 mg/dL — ABNORMAL HIGH (ref 7–18)
Chloride: 107 mmol/L (ref 98–107)
Creatinine: 1.99 mg/dL — ABNORMAL HIGH (ref 0.60–1.30)
EGFR (African American): 42 — ABNORMAL LOW
EGFR (Non-African Amer.): 36 — ABNORMAL LOW
Glucose: 194 mg/dL — ABNORMAL HIGH (ref 65–99)
Osmolality: 285 (ref 275–301)

## 2012-02-19 LAB — MAGNESIUM: Magnesium: 1.9 mg/dL

## 2012-02-20 LAB — URINE CULTURE

## 2012-02-20 LAB — BASIC METABOLIC PANEL
Anion Gap: 9 (ref 7–16)
BUN: 25 mg/dL — ABNORMAL HIGH (ref 7–18)
BUN: 26 mg/dL — ABNORMAL HIGH (ref 7–18)
Calcium, Total: 8.1 mg/dL — ABNORMAL LOW (ref 8.5–10.1)
Calcium, Total: 8 mg/dL — ABNORMAL LOW (ref 8.5–10.1)
Co2: 21 mmol/L (ref 21–32)
EGFR (Non-African Amer.): 32 — ABNORMAL LOW
Glucose: 256 mg/dL — ABNORMAL HIGH (ref 65–99)
Osmolality: 287 (ref 275–301)
Potassium: 4.2 mmol/L (ref 3.5–5.1)
Potassium: 4.4 mmol/L (ref 3.5–5.1)
Sodium: 139 mmol/L (ref 136–145)
Sodium: 136 mmol/L (ref 136–145)

## 2012-02-20 LAB — PROTEIN ELECTROPHORESIS(ARMC)

## 2012-02-24 LAB — CBC
HCT: 32.4 % — ABNORMAL LOW (ref 40.0–52.0)
HGB: 11.2 g/dL — ABNORMAL LOW (ref 13.0–18.0)
MCH: 29 pg (ref 26.0–34.0)
MCHC: 34.6 g/dL (ref 32.0–36.0)
Platelet: 250 10*3/uL (ref 150–440)
RBC: 3.86 10*6/uL — ABNORMAL LOW (ref 4.40–5.90)
WBC: 15 10*3/uL — ABNORMAL HIGH (ref 3.8–10.6)

## 2012-02-25 LAB — URINALYSIS, COMPLETE
Blood: NEGATIVE
Nitrite: NEGATIVE
Protein: 100
Specific Gravity: 1.002 (ref 1.003–1.030)
WBC UR: 19 /HPF (ref 0–5)

## 2012-02-25 LAB — BASIC METABOLIC PANEL
Calcium, Total: 8.7 mg/dL (ref 8.5–10.1)
Co2: 27 mmol/L (ref 21–32)
EGFR (Non-African Amer.): 38 — ABNORMAL LOW
Glucose: 164 mg/dL — ABNORMAL HIGH (ref 65–99)
Osmolality: 282 (ref 275–301)
Potassium: 3.7 mmol/L (ref 3.5–5.1)
Sodium: 138 mmol/L (ref 136–145)

## 2012-02-25 LAB — COMPREHENSIVE METABOLIC PANEL
Albumin: 2.2 g/dL — ABNORMAL LOW (ref 3.4–5.0)
Alkaline Phosphatase: 91 U/L (ref 50–136)
Bilirubin,Total: 0.3 mg/dL (ref 0.2–1.0)
Calcium, Total: 8.7 mg/dL (ref 8.5–10.1)
Chloride: 101 mmol/L (ref 98–107)
Co2: 25 mmol/L (ref 21–32)
Creatinine: 1.88 mg/dL — ABNORMAL HIGH (ref 0.60–1.30)
EGFR (African American): 45 — ABNORMAL LOW
EGFR (Non-African Amer.): 39 — ABNORMAL LOW
Osmolality: 278 (ref 275–301)
SGPT (ALT): 19 U/L (ref 12–78)
Total Protein: 6.7 g/dL (ref 6.4–8.2)

## 2012-02-25 LAB — CBC WITH DIFFERENTIAL/PLATELET
Eosinophil #: 0.3 10*3/uL (ref 0.0–0.7)
Eosinophil %: 1.8 %
MCH: 28.5 pg (ref 26.0–34.0)
Monocyte #: 1.4 x10 3/mm — ABNORMAL HIGH (ref 0.2–1.0)
Neutrophil %: 76.3 %
Platelet: 258 10*3/uL (ref 150–440)
RBC: 3.74 10*6/uL — ABNORMAL LOW (ref 4.40–5.90)
WBC: 14.9 10*3/uL — ABNORMAL HIGH (ref 3.8–10.6)

## 2012-02-25 LAB — CK TOTAL AND CKMB (NOT AT ARMC)
CK, Total: 82 U/L (ref 35–232)
CK-MB: 0.8 ng/mL (ref 0.5–3.6)

## 2012-02-25 LAB — LIPASE, BLOOD: Lipase: 104 U/L (ref 73–393)

## 2012-02-25 LAB — TROPONIN I: Troponin-I: 0.02 ng/mL

## 2012-02-26 LAB — CBC WITH DIFFERENTIAL/PLATELET
Basophil %: 0.9 %
Eosinophil #: 0.7 10*3/uL (ref 0.0–0.7)
Lymphocyte %: 15.7 %
MCH: 28.7 pg (ref 26.0–34.0)
MCHC: 34.2 g/dL (ref 32.0–36.0)
Monocyte #: 1.5 x10 3/mm — ABNORMAL HIGH (ref 0.2–1.0)
Neutrophil %: 67.9 %
Platelet: 257 10*3/uL (ref 150–440)
RDW: 14 % (ref 11.5–14.5)
WBC: 14.3 10*3/uL — ABNORMAL HIGH (ref 3.8–10.6)

## 2012-02-26 LAB — BASIC METABOLIC PANEL
Chloride: 107 mmol/L (ref 98–107)
Co2: 26 mmol/L (ref 21–32)
Creatinine: 1.83 mg/dL — ABNORMAL HIGH (ref 0.60–1.30)
Potassium: 3.6 mmol/L (ref 3.5–5.1)
Sodium: 141 mmol/L (ref 136–145)

## 2012-02-27 ENCOUNTER — Inpatient Hospital Stay: Payer: Self-pay | Admitting: Internal Medicine

## 2012-02-27 LAB — LIPID PANEL
HDL Cholesterol: 29 mg/dL — ABNORMAL LOW (ref 40–60)
Triglycerides: 126 mg/dL (ref 0–200)

## 2012-02-27 LAB — URINE CULTURE

## 2012-02-28 LAB — CBC WITH DIFFERENTIAL/PLATELET
Basophil #: 0.1 10*3/uL (ref 0.0–0.1)
Basophil %: 0.6 %
Eosinophil %: 0.6 %
HCT: 33.5 % — ABNORMAL LOW (ref 40.0–52.0)
HGB: 11 g/dL — ABNORMAL LOW (ref 13.0–18.0)
Lymphocyte #: 1.4 10*3/uL (ref 1.0–3.6)
Lymphocyte %: 7.3 %
MCH: 27.8 pg (ref 26.0–34.0)
MCHC: 32.8 g/dL (ref 32.0–36.0)
MCV: 85 fL (ref 80–100)
Monocyte #: 1.2 x10 3/mm — ABNORMAL HIGH (ref 0.2–1.0)
RBC: 3.96 10*6/uL — ABNORMAL LOW (ref 4.40–5.90)

## 2012-02-28 LAB — URINALYSIS, COMPLETE
Bacteria: NONE SEEN
Bilirubin,UR: NEGATIVE
Ph: 5 (ref 4.5–8.0)
Protein: >=500
RBC,UR: 1 /HPF (ref 0–5)
Specific Gravity: 1.013 (ref 1.003–1.030)
Squamous Epithelial: 1
WBC UR: 25 /HPF (ref 0–5)

## 2012-02-29 LAB — CBC WITH DIFFERENTIAL/PLATELET
Eosinophil #: 0.6 10*3/uL (ref 0.0–0.7)
Eosinophil %: 3.9 %
Lymphocyte #: 2.7 10*3/uL (ref 1.0–3.6)
MCH: 28.6 pg (ref 26.0–34.0)
MCHC: 33.9 g/dL (ref 32.0–36.0)
MCV: 84 fL (ref 80–100)
Monocyte #: 1.4 x10 3/mm — ABNORMAL HIGH (ref 0.2–1.0)
Monocyte %: 8.6 %
Neutrophil #: 11.6 10*3/uL — ABNORMAL HIGH (ref 1.4–6.5)
Neutrophil %: 70.2 %
Platelet: 261 10*3/uL (ref 150–440)
RBC: 3.45 10*6/uL — ABNORMAL LOW (ref 4.40–5.90)

## 2012-02-29 LAB — BASIC METABOLIC PANEL
Anion Gap: 7 (ref 7–16)
Calcium, Total: 7.9 mg/dL — ABNORMAL LOW (ref 8.5–10.1)
Chloride: 100 mmol/L (ref 98–107)
Co2: 30 mmol/L (ref 21–32)
EGFR (Non-African Amer.): 27 — ABNORMAL LOW
Osmolality: 278 (ref 275–301)

## 2012-03-01 LAB — CBC WITH DIFFERENTIAL/PLATELET
Basophil %: 0.8 %
Eosinophil #: 0.8 10*3/uL — ABNORMAL HIGH (ref 0.0–0.7)
Eosinophil %: 5.5 %
MCH: 28.7 pg (ref 26.0–34.0)
MCHC: 34.1 g/dL (ref 32.0–36.0)
MCV: 84 fL (ref 80–100)
Neutrophil #: 9.8 10*3/uL — ABNORMAL HIGH (ref 1.4–6.5)
Neutrophil %: 70.6 %
Platelet: 256 10*3/uL (ref 150–440)
RBC: 3.53 10*6/uL — ABNORMAL LOW (ref 4.40–5.90)

## 2012-03-01 LAB — BASIC METABOLIC PANEL
BUN: 24 mg/dL — ABNORMAL HIGH (ref 7–18)
Calcium, Total: 8 mg/dL — ABNORMAL LOW (ref 8.5–10.1)
Chloride: 104 mmol/L (ref 98–107)
Co2: 29 mmol/L (ref 21–32)
EGFR (African American): 24 — ABNORMAL LOW
Glucose: 116 mg/dL — ABNORMAL HIGH (ref 65–99)
Osmolality: 286 (ref 275–301)
Potassium: 3.3 mmol/L — ABNORMAL LOW (ref 3.5–5.1)

## 2012-03-01 LAB — CULTURE, BLOOD (SINGLE)

## 2012-03-02 LAB — CBC WITH DIFFERENTIAL/PLATELET
Basophil %: 1.1 %
Eosinophil %: 5.5 %
HGB: 9.7 g/dL — ABNORMAL LOW (ref 13.0–18.0)
Lymphocyte #: 2.3 10*3/uL (ref 1.0–3.6)
Lymphocyte %: 19.2 %
MCH: 29 pg (ref 26.0–34.0)
MCHC: 34.3 g/dL (ref 32.0–36.0)
MCV: 85 fL (ref 80–100)
Monocyte #: 1.1 x10 3/mm — ABNORMAL HIGH (ref 0.2–1.0)
Neutrophil %: 65 %
Platelet: 248 10*3/uL (ref 150–440)
RBC: 3.35 10*6/uL — ABNORMAL LOW (ref 4.40–5.90)
RDW: 14.2 % (ref 11.5–14.5)

## 2012-03-02 LAB — URINE CULTURE

## 2012-03-02 LAB — BASIC METABOLIC PANEL
Anion Gap: 8 (ref 7–16)
Calcium, Total: 8 mg/dL — ABNORMAL LOW (ref 8.5–10.1)
Co2: 27 mmol/L (ref 21–32)
EGFR (African American): 21 — ABNORMAL LOW
EGFR (Non-African Amer.): 19 — ABNORMAL LOW
Glucose: 120 mg/dL — ABNORMAL HIGH (ref 65–99)
Potassium: 3.4 mmol/L — ABNORMAL LOW (ref 3.5–5.1)
Sodium: 138 mmol/L (ref 136–145)

## 2012-03-03 LAB — BASIC METABOLIC PANEL
Anion Gap: 8 (ref 7–16)
BUN: 20 mg/dL — ABNORMAL HIGH (ref 7–18)
Chloride: 107 mmol/L (ref 98–107)
Co2: 26 mmol/L (ref 21–32)
Creatinine: 3.18 mg/dL — ABNORMAL HIGH (ref 0.60–1.30)
EGFR (African American): 24 — ABNORMAL LOW
Sodium: 141 mmol/L (ref 136–145)

## 2012-03-03 LAB — MAGNESIUM: Magnesium: 2.1 mg/dL

## 2012-03-17 LAB — CBC
MCV: 85 fL (ref 80–100)
Platelet: 309 10*3/uL (ref 150–440)
RBC: 3.76 10*6/uL — ABNORMAL LOW (ref 4.40–5.90)
RDW: 14.2 % (ref 11.5–14.5)
WBC: 10.7 10*3/uL — ABNORMAL HIGH (ref 3.8–10.6)

## 2012-03-17 LAB — COMPREHENSIVE METABOLIC PANEL
Albumin: 2.8 g/dL — ABNORMAL LOW (ref 3.4–5.0)
Alkaline Phosphatase: 86 U/L (ref 50–136)
Bilirubin,Total: 0.3 mg/dL (ref 0.2–1.0)
Calcium, Total: 8.6 mg/dL (ref 8.5–10.1)
Chloride: 107 mmol/L (ref 98–107)
Co2: 22 mmol/L (ref 21–32)
EGFR (African American): 29 — ABNORMAL LOW
EGFR (Non-African Amer.): 25 — ABNORMAL LOW
Glucose: 174 mg/dL — ABNORMAL HIGH (ref 65–99)
Osmolality: 285 (ref 275–301)
SGOT(AST): 21 U/L (ref 15–37)
SGPT (ALT): 14 U/L (ref 12–78)
Sodium: 137 mmol/L (ref 136–145)

## 2012-03-17 LAB — URINALYSIS, COMPLETE
Glucose,UR: 50 mg/dL (ref 0–75)
Leukocyte Esterase: NEGATIVE
Nitrite: NEGATIVE
Protein: 100
RBC,UR: 1 /HPF (ref 0–5)
Specific Gravity: 1.01 (ref 1.003–1.030)
WBC UR: 3 /HPF (ref 0–5)

## 2012-03-17 LAB — CK TOTAL AND CKMB (NOT AT ARMC)
CK, Total: 53 U/L (ref 35–232)
CK-MB: 0.7 ng/mL (ref 0.5–3.6)

## 2012-03-18 LAB — BASIC METABOLIC PANEL
Anion Gap: 8 (ref 7–16)
BUN: 30 mg/dL — ABNORMAL HIGH (ref 7–18)
Chloride: 109 mmol/L — ABNORMAL HIGH (ref 98–107)
Creatinine: 2.33 mg/dL — ABNORMAL HIGH (ref 0.60–1.30)
EGFR (African American): 34 — ABNORMAL LOW
EGFR (Non-African Amer.): 29 — ABNORMAL LOW
Glucose: 141 mg/dL — ABNORMAL HIGH (ref 65–99)
Osmolality: 288 (ref 275–301)
Sodium: 140 mmol/L (ref 136–145)

## 2012-03-18 LAB — CBC WITH DIFFERENTIAL/PLATELET
Basophil %: 1.4 %
Eosinophil #: 0.5 10*3/uL (ref 0.0–0.7)
Eosinophil %: 4.8 %
HCT: 29.2 % — ABNORMAL LOW (ref 40.0–52.0)
HGB: 10 g/dL — ABNORMAL LOW (ref 13.0–18.0)
Lymphocyte #: 1.6 10*3/uL (ref 1.0–3.6)
MCH: 28.8 pg (ref 26.0–34.0)
MCHC: 34.3 g/dL (ref 32.0–36.0)
MCV: 84 fL (ref 80–100)
Monocyte #: 1 x10 3/mm (ref 0.2–1.0)
Monocyte %: 10.3 %
Neutrophil #: 6.6 10*3/uL — ABNORMAL HIGH (ref 1.4–6.5)
Neutrophil %: 67.1 %
Platelet: 290 10*3/uL (ref 150–440)
RBC: 3.47 10*6/uL — ABNORMAL LOW (ref 4.40–5.90)
RDW: 13.8 % (ref 11.5–14.5)

## 2012-03-19 ENCOUNTER — Inpatient Hospital Stay: Payer: Self-pay | Admitting: Internal Medicine

## 2012-03-19 LAB — BASIC METABOLIC PANEL
Anion Gap: 7 (ref 7–16)
BUN: 23 mg/dL — ABNORMAL HIGH (ref 7–18)
Calcium, Total: 8.7 mg/dL (ref 8.5–10.1)
Chloride: 108 mmol/L — ABNORMAL HIGH (ref 98–107)
Co2: 24 mmol/L (ref 21–32)
Creatinine: 2.28 mg/dL — ABNORMAL HIGH (ref 0.60–1.30)
EGFR (African American): 35 — ABNORMAL LOW
Glucose: 86 mg/dL (ref 65–99)
Osmolality: 281 (ref 275–301)

## 2012-03-21 LAB — BASIC METABOLIC PANEL
BUN: 26 mg/dL — ABNORMAL HIGH (ref 7–18)
Chloride: 104 mmol/L (ref 98–107)
Co2: 27 mmol/L (ref 21–32)
Creatinine: 2.37 mg/dL — ABNORMAL HIGH (ref 0.60–1.30)
Glucose: 123 mg/dL — ABNORMAL HIGH (ref 65–99)
Osmolality: 280 (ref 275–301)
Potassium: 4 mmol/L (ref 3.5–5.1)
Sodium: 137 mmol/L (ref 136–145)

## 2012-03-22 LAB — BASIC METABOLIC PANEL
Anion Gap: 9 (ref 7–16)
Co2: 25 mmol/L (ref 21–32)
Creatinine: 2.5 mg/dL — ABNORMAL HIGH (ref 0.60–1.30)
EGFR (African American): 31 — ABNORMAL LOW
Potassium: 3.9 mmol/L (ref 3.5–5.1)
Sodium: 136 mmol/L (ref 136–145)

## 2012-03-25 LAB — CBC WITH DIFFERENTIAL/PLATELET
Basophil %: 1.2 %
Eosinophil #: 0.5 10*3/uL (ref 0.0–0.7)
Eosinophil %: 5.3 %
HGB: 10.6 g/dL — ABNORMAL LOW (ref 13.0–18.0)
Lymphocyte %: 25.4 %
MCH: 29.9 pg (ref 26.0–34.0)
MCHC: 35.7 g/dL (ref 32.0–36.0)
MCV: 84 fL (ref 80–100)
Monocyte #: 0.9 x10 3/mm (ref 0.2–1.0)
Monocyte %: 10.3 %
Neutrophil %: 57.8 %
Platelet: 239 10*3/uL (ref 150–440)
RBC: 3.53 10*6/uL — ABNORMAL LOW (ref 4.40–5.90)
WBC: 9.1 10*3/uL (ref 3.8–10.6)

## 2012-03-25 LAB — BASIC METABOLIC PANEL
Anion Gap: 8 (ref 7–16)
BUN: 43 mg/dL — ABNORMAL HIGH (ref 7–18)
Creatinine: 2.82 mg/dL — ABNORMAL HIGH (ref 0.60–1.30)
EGFR (Non-African Amer.): 23 — ABNORMAL LOW
Glucose: 125 mg/dL — ABNORMAL HIGH (ref 65–99)
Potassium: 3.7 mmol/L (ref 3.5–5.1)

## 2012-03-26 LAB — HEMOGLOBIN A1C: Hemoglobin A1C: 8.6 % — ABNORMAL HIGH (ref 4.2–6.3)

## 2012-03-28 LAB — BASIC METABOLIC PANEL
Anion Gap: 7 (ref 7–16)
BUN: 50 mg/dL — ABNORMAL HIGH (ref 7–18)
Chloride: 101 mmol/L (ref 98–107)
Creatinine: 2.92 mg/dL — ABNORMAL HIGH (ref 0.60–1.30)
EGFR (Non-African Amer.): 22 — ABNORMAL LOW
Glucose: 175 mg/dL — ABNORMAL HIGH (ref 65–99)
Osmolality: 293 (ref 275–301)
Potassium: 3.6 mmol/L (ref 3.5–5.1)

## 2012-03-29 LAB — BASIC METABOLIC PANEL
Anion Gap: 6 — ABNORMAL LOW (ref 7–16)
Chloride: 100 mmol/L (ref 98–107)
Co2: 28 mmol/L (ref 21–32)
Creatinine: 2.53 mg/dL — ABNORMAL HIGH (ref 0.60–1.30)
EGFR (African American): 31 — ABNORMAL LOW
Osmolality: 286 (ref 275–301)
Sodium: 134 mmol/L — ABNORMAL LOW (ref 136–145)

## 2012-03-29 LAB — CBC WITH DIFFERENTIAL/PLATELET
Eosinophil %: 0.1 %
HCT: 30.2 % — ABNORMAL LOW (ref 40.0–52.0)
HGB: 10.2 g/dL — ABNORMAL LOW (ref 13.0–18.0)
Lymphocyte #: 0.9 10*3/uL — ABNORMAL LOW (ref 1.0–3.6)
MCH: 28.5 pg (ref 26.0–34.0)
MCV: 84 fL (ref 80–100)
Monocyte #: 0.5 x10 3/mm (ref 0.2–1.0)
Monocyte %: 4.8 %
Neutrophil #: 8.7 10*3/uL — ABNORMAL HIGH (ref 1.4–6.5)
RDW: 14.1 % (ref 11.5–14.5)
WBC: 10.1 10*3/uL (ref 3.8–10.6)

## 2012-04-01 LAB — CREATININE, SERUM
Creatinine: 2.42 mg/dL — ABNORMAL HIGH (ref 0.60–1.30)
EGFR (African American): 33 — ABNORMAL LOW
EGFR (Non-African Amer.): 28 — ABNORMAL LOW

## 2012-05-24 DEATH — deceased

## 2013-12-12 IMAGING — CR DG CHEST 1V PORT
1 series · 1 of 1 positions shown · non-contrast
Comparison: none

REASON FOR EXAM: Chest Pain
COMMENTS:

[ap]
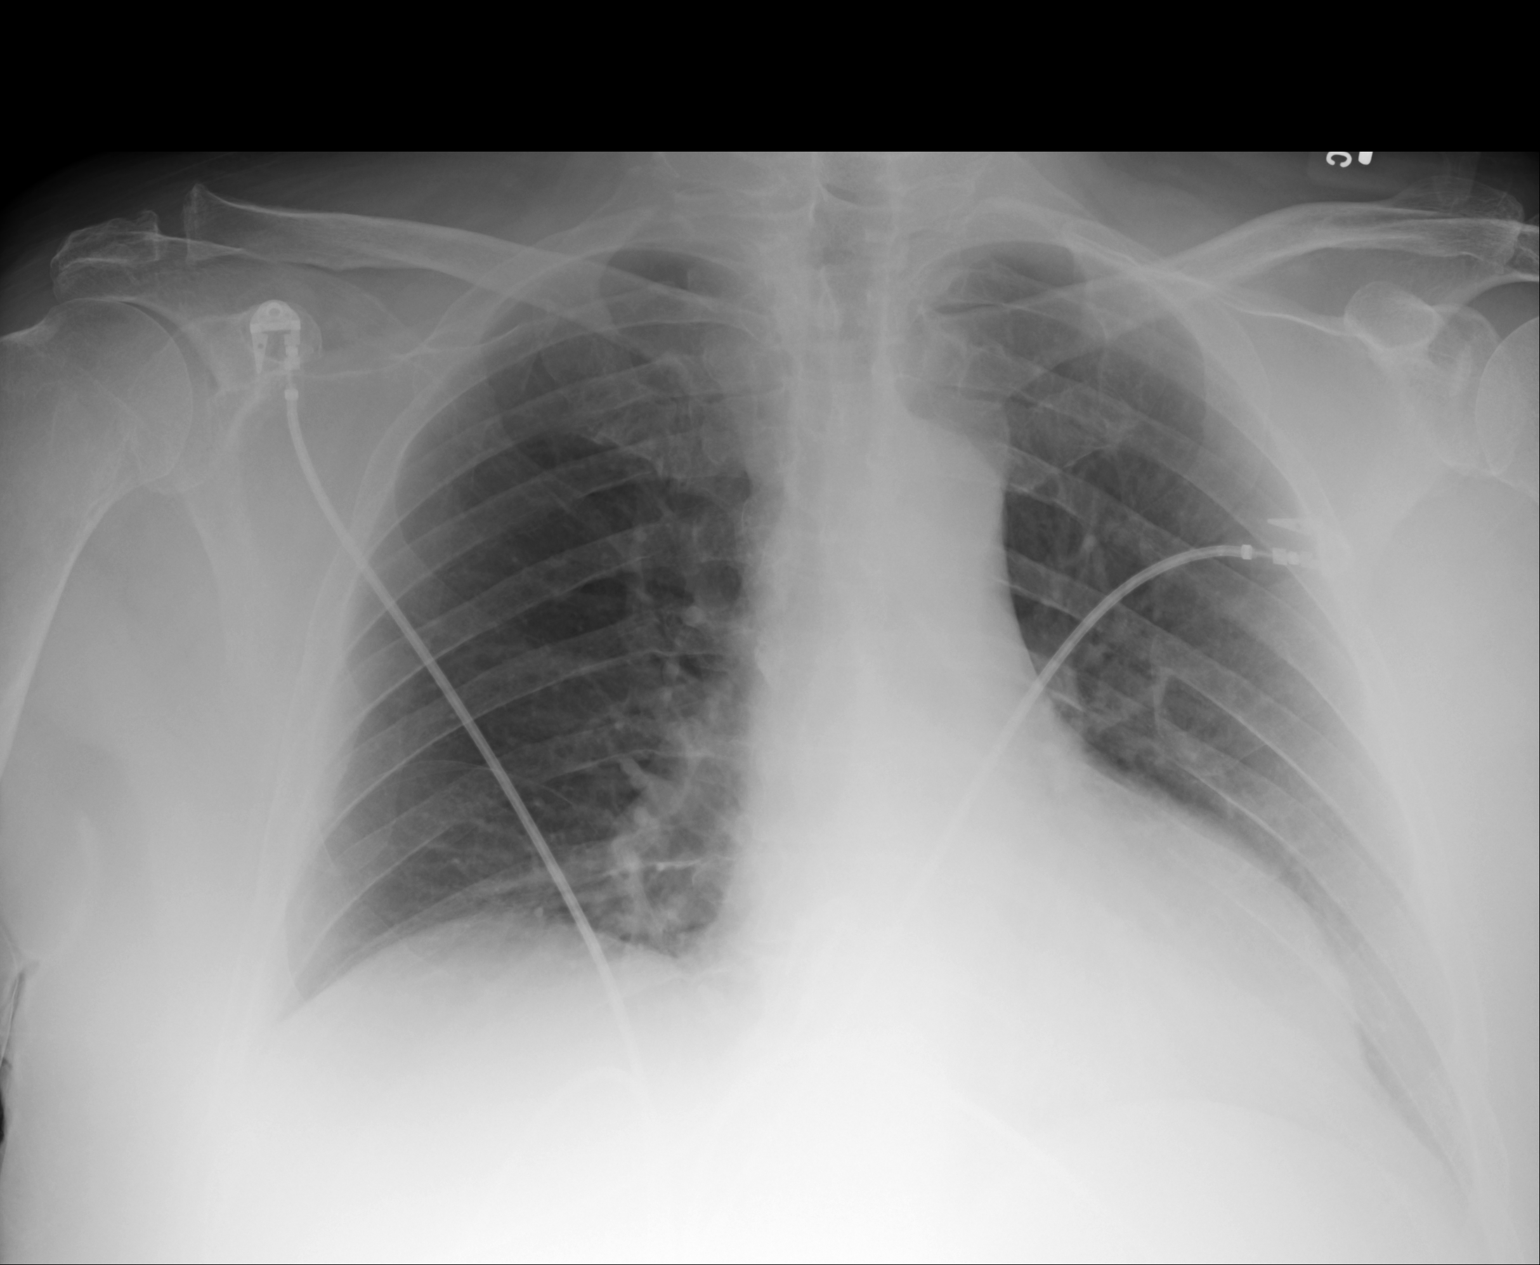

[1 of 1 positions shown; findings below may reference images not displayed]

PROCEDURE:     DXR - DXR PORTABLE CHEST SINGLE VIEW  - February 15, 2012 [DATE]

RESULT:     The lungs are reasonably well inflated. The retrocardiac region
on the left is somewhat dense. The cardiac silhouette is mildly enlarged and
the perihilar lung markings are increased on the right. There is no definite
pleural effusion. The mediastinum is normal in width.
IMPRESSION: I cannot exclude minimal perihilar and left lower lobe
atelectasis in the appropriate clinical setting. The chest film is limited
due to patient's body habitus. A followup PA and lateral chest x-ray may be
useful if the patient's chest discomfort persists.

[REDACTED]

## 2014-01-12 IMAGING — CT CT HEAD WITHOUT CONTRAST
2 series · 15 of 30 positions shown, 19 images · non-contrast
Comparison: none

REASON FOR EXAM: weak ha
COMMENTS:

PROCEDURE:     CT  - CT HEAD WITHOUT CONTRAST  - March 17, 2012  [DATE]
RESULT:     Comparison:  None
TECHNIQUE: Multiple axial images from the foramen magnum to the vertex were
obtained without IV contrast.

[Series 2: without · axial · non-contrast · 0.43mm/px · z∈[+445,+590]mm · 13 of 35 slices shown, 17 images]
[im 3/35  brain]
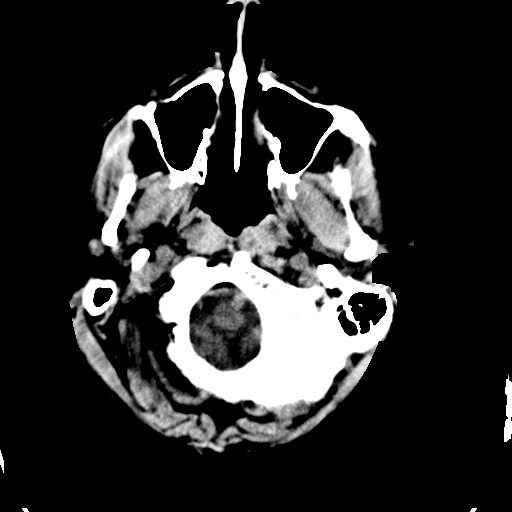
[im 3/35  bone]
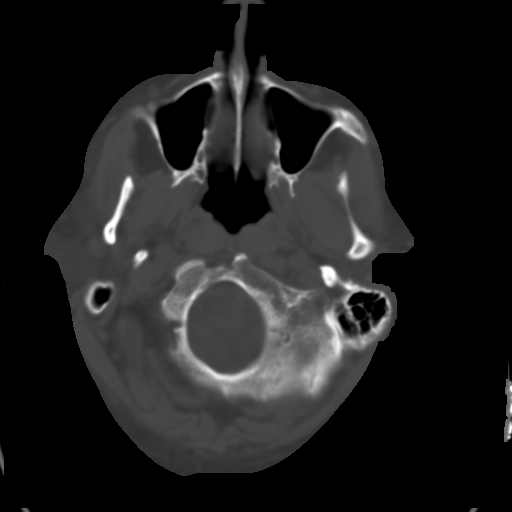
[im 5/35  brain]
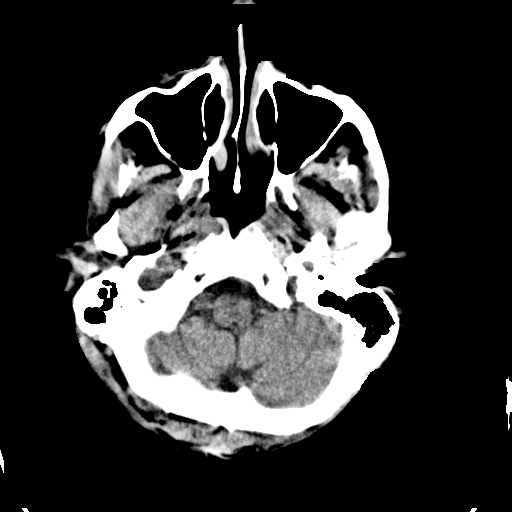
[im 8/35  brain]
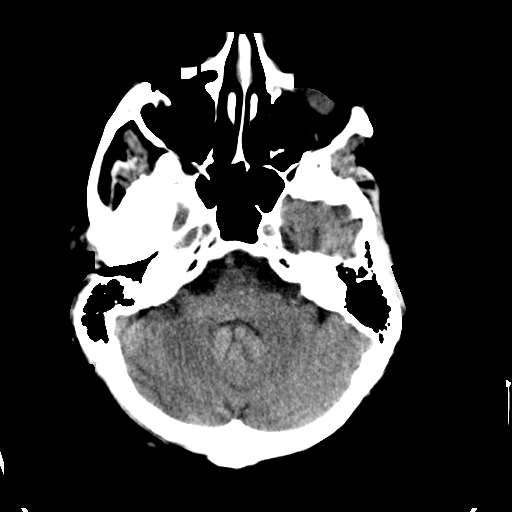
[im 10/35  brain]
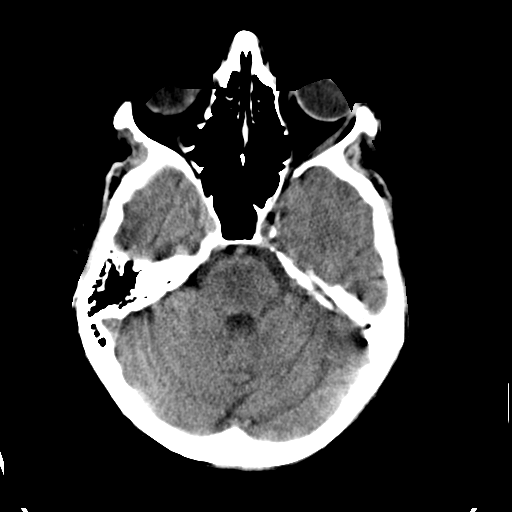
[im 13/35  brain]
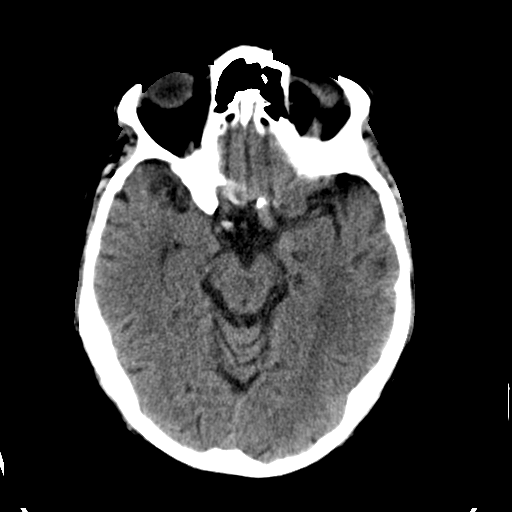
[im 13/35  bone]
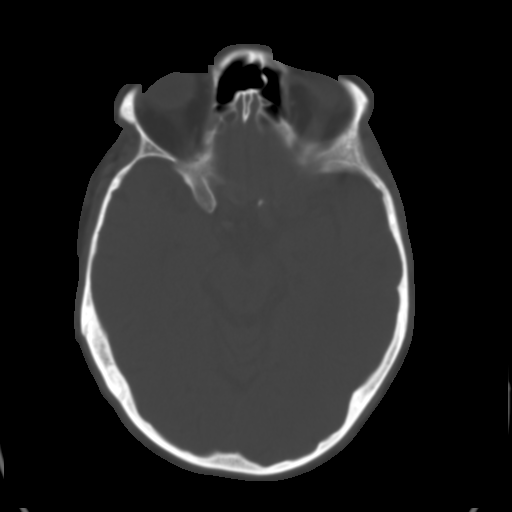
[im 15/35  brain]
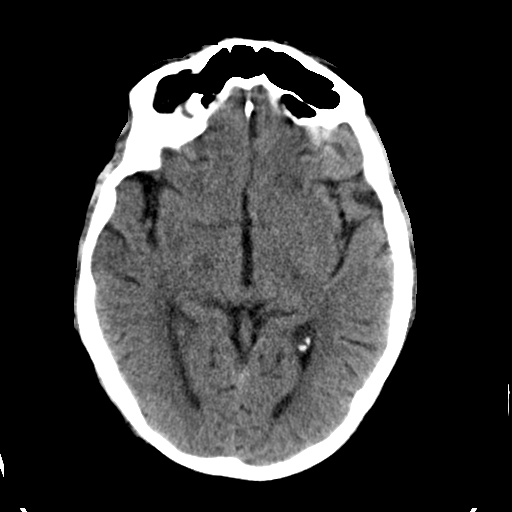
[im 18/35  brain]
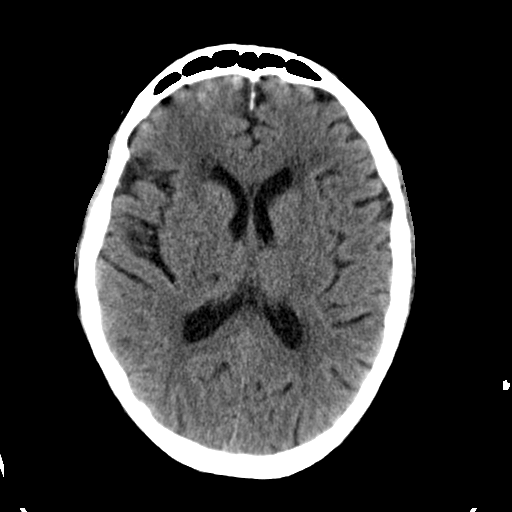
[im 20/35  brain]
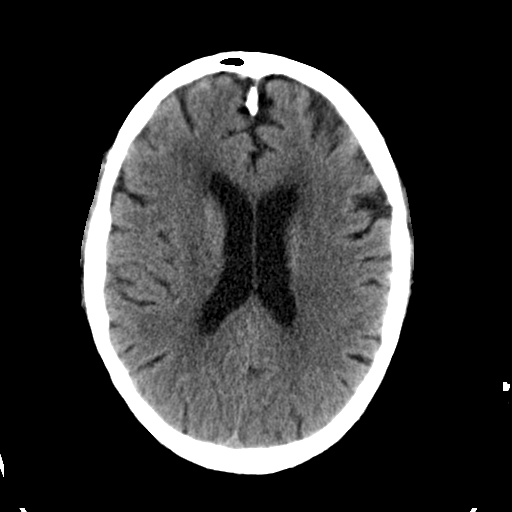
[im 22/35  brain]
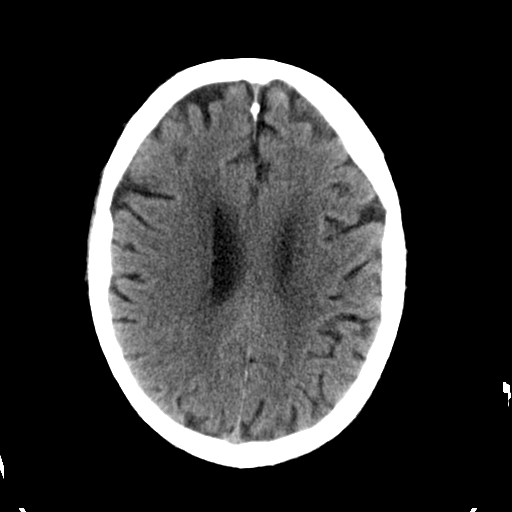
[im 22/35  bone]
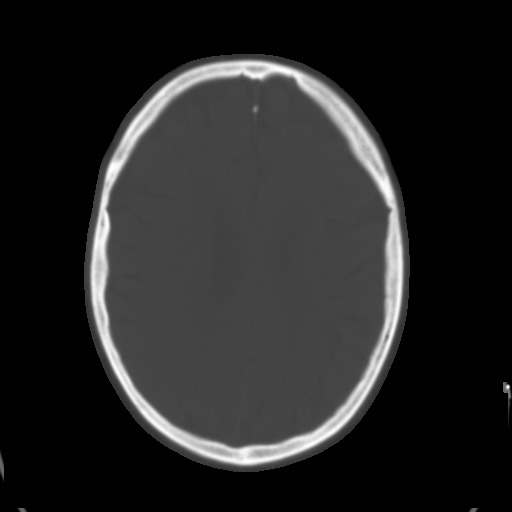
[im 25/35  brain]
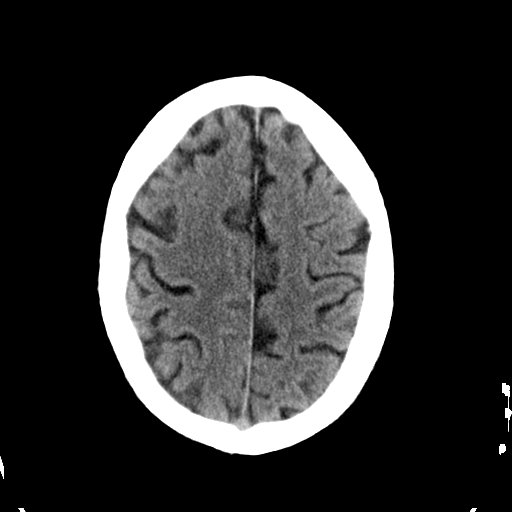
[im 27/35  brain]
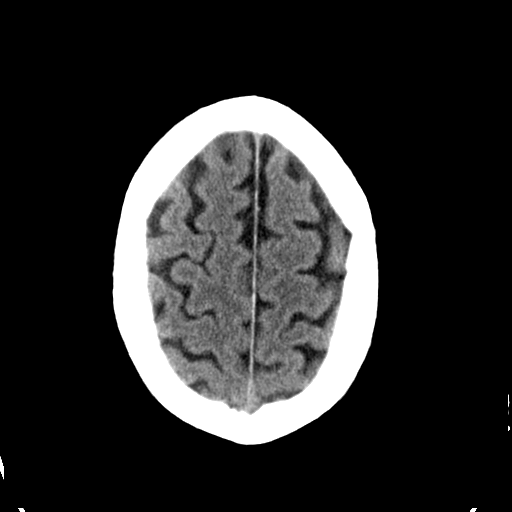
[im 30/35  brain]
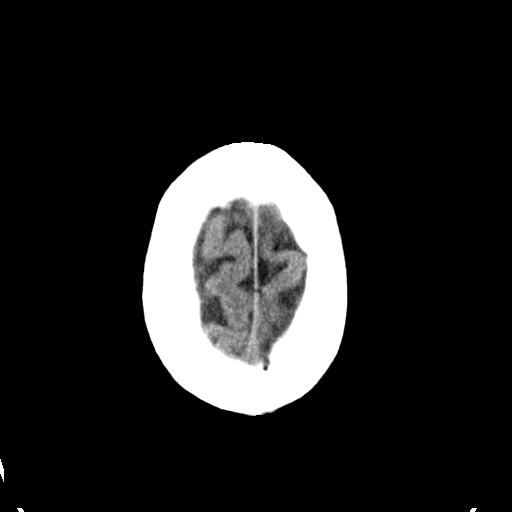
[im 32/35  brain]
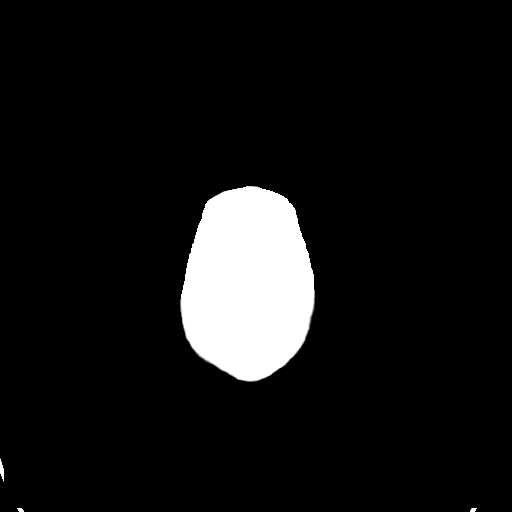
[im 32/35  bone]
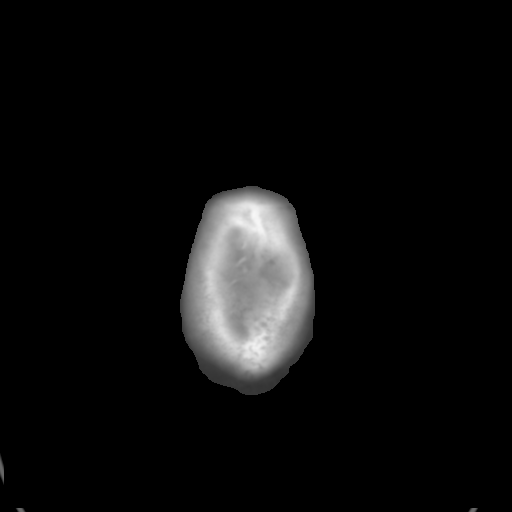

[Series 3: bone · axial · 0.43mm/px · z∈[+445,+470]mm · 2 of 35 slices shown]
[im 3/35  bone]
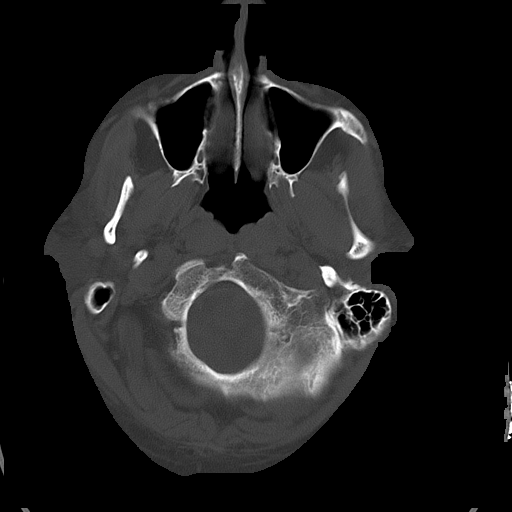
[im 8/35  bone]
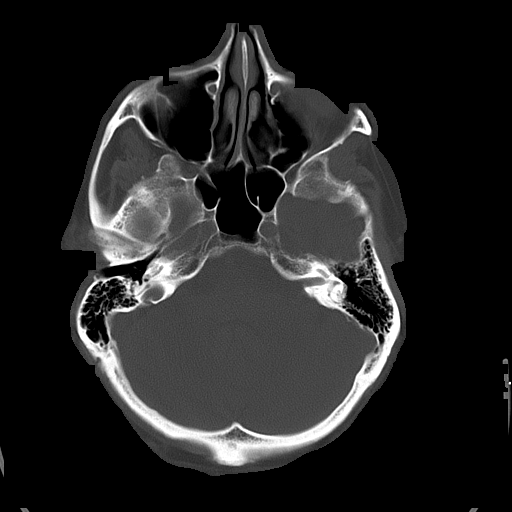

[15 of 30 positions shown; findings below may reference images not displayed]

FINDINGS: There is no evidence of mass effect, midline shift, or extra-axial fluid
collections.  There is no evidence of a space-occupying lesion or
intracranial hemorrhage. There is no evidence of a cortical-based area of
acute infarction. There is generalized cerebral atrophy. There is
periventricular white matter low attenuation likely secondary to
microangiopathy.

The ventricles and sulci are appropriate for the patient's age. The basal
cisterns are patent.

Visualized portions of the orbits are unremarkable. The visualized portions
of the paranasal sinuses and mastoid air cells are unremarkable.
Cerebrovascular atherosclerotic calcifications are noted.

The osseous structures are unremarkable.
IMPRESSION: No acute intracranial process.

[REDACTED]

## 2014-08-10 NOTE — H&P (Signed)
PATIENT NAME:  [name], [name] MR#:  [id number] DATE OF BIRTH:  [birth year]  DATE OF ADMISSION:  02/25/2012  PRIMARY CARE PHYSICIAN:  None.   ER REFERRING PHYSICIAN: Suella Broad, MD   CHIEF COMPLAINT: Generalized weakness and emesis.   HISTORY OF PRESENT ILLNESS: The patient is a 64 year old male recently discharged from the hospital four days ago after having sustained malignant hypertension, presents today with generalized weakness and one episode of emesis. The patient reports that since discharge he has been very weak. Per family, he has only been out of bed to the chair twice in the last four days. He notes that he has had difficulty walking, just felt like he could not move his legs, but he has not sustained any falls. He denies fevers, dysuria, abdominal pain, frequency. He had a bottle of  Glucerna this evening and a few minutes later subsequently threw that up as well as his lunch. He denies chest pain. He notes that he has had some lightheadedness with position changes, particularly when he stands up. He also notes that since being discharged he has not had a bowel movement. His bowel movement was actually prior to discharge, and since then he has been constipated for the last 4 to 5 days. The patient checked his blood sugar on the afternoon of presentation and it was 188. He has been compliant with his medications. He was evaluated by Physical Therapy prior to discharge and was discharged with Home Health services.   PAST MEDICAL HISTORY:  1. Hypertension.  2. Diabetes.  3. Diabetic retinopathy.  4. History of cerebrovascular accident.  5. Chronic kidney disease, stage 3.  PAST SURGICAL HISTORY: No surgeries.   ALLERGIES: No known drug allergies.   MEDICATIONS: Per patient, he is taking all the medications that were prescribed to him on discharge which include:  1. Aspirin 81 mg daily.  2. Atropine Care 1% ophthalmic solution, 1 drop to left eye.  3. Brimonidine 0.2% ophthalmic  solution, 1 drop to left eye t.i.d.  4. Timolol 0.5% ophthalmic solution, 1 drop to left eye daily.  5. Clonidine 0.2 mg, 1 tablet b.i.d.  6. Glipizide 10 mg, 1 tablet b.i.d. before meals.  7. Hydralazine 100 mg 1 tablet t.i.d.  8. Hytrin 2 mg, 1 tablet daily.  9. Metoprolol tartrate 50 mg, 1 tablet every 12 hours.  10. Pravachol 20 mg, 1 tablet daily at bedtime.   FAMILY HISTORY: Family history is notable for hypertension, diabetes.   SOCIAL HISTORY: He denies alcohol, illicit drug use or tobacco use.   REVIEW OF SYSTEMS: CONSTITUTIONAL: He denies fever, admits to fatigue and generalized weakness. He denies weight loss. 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 No eye pain. ENT: Denies tinnitus, ear pain difficulty swallowing. RESPIRATORY: Denies cough, painful respirations. He notes that he has some dyspnea with walking. CARDIOVASCULAR: He denies chest pain. He admits to lightheadedness with standing. He admits to dependent ankle edema. GASTROINTESTINAL: One episode of vomiting today but denies diarrhea, abdominal pain, melena, or rectal bleeding. GENITOURINARY: Denies dysuria, frequency. ENDOCRINE: Denies polyuria, polydipsia, or polyphagia. PSYCHIATRIC: Denies any skin rashes. MUSCULOSKELETAL: Denies myalgias, arthralgias. NEUROLOGICAL:  Admits to some mild numbness in both hands that is chronic, denies headaches. PSYCHIATRIC: Denies anxiety or depression.   PHYSICAL EXAMINATION:  VITAL SIGNS: Temperature 97.9, heart rate 99, respirations 18, blood pressure 106/71.   GENERAL: The patient appears weak. He is speaking slowly but  is in no acute distress.   PSYCHIATRIC: Awake, oriented x3. He is alert but again just appears very weak. Judgment appears intact.   HEENT: Asymmetrical pupils about 4 mm diameter in the left eye that is unresponsive to light and 2 mm in the right eye. Extraocular muscles are intact. The right pupil reacts to  light. Anicteric sclerae. ENT: Normal external ears and nares. Posterior oropharynx clear without erythema or exudate.  LUNGS: Lungs are clear to auscultation bilaterally, normal effort.   CARDIOVASCULAR: S1, S2, regular rate and rhythm. No murmurs. No pretibial edema.   ABDOMEN: Soft, nontender, nondistended. No hepatosplenomegaly.   MUSCULOSKELETAL: Full range of motion of all extremities. No clubbing, no cyanosis.   SKIN: There are ecchymoses on the abdomen, otherwise skin is warm and dry. He has ecchymosis on the left lower abdomen, likely at site of prior anticoagulate injections. Otherwise skin is warm and dry.   NEUROLOGICAL: Muscle strength 5/5 in bilateral upper and lower extremities proximally and distal muscle groups. Sensation is grossly intact.   LABORATORY, DIAGNOSTIC AND RADIOLOGICAL DATA: White count 15,000, hemoglobin 11.2, hematocrit 32.4, platelets 250. Glucose is 191, BUN 21, creatinine 1.88, which is down from discharge. At that time creatinine was 2.2. Sodium 135, potassium 3.5, chloride 101, bicarbonate 25, calcium 8.7, bilirubin 0.3, alkaline phosphatase 91, ALT 19, AST 18, total protein 6.7, albumin 2.2, osmolality 278, anion gap 9.0. Urinalysis shows 100 mg/dL of protein, negative nitrites, 1+ leukocyte esterase, 4 RBCs per high-power field, and 19 white blood cells per high-power field, trace epithelial cells. Prior urine culture speciated on 10/27, 25,000 Candida albicans from an indwelling catheter.  CK 82, CPK-MB 0.8, lipase 104.0. Troponin is 0.02. Point of care Accu-Chek is 183. EKG shows normal sinus rhythm at 93 beats per minute. Chest x-ray: No acute infiltrates, preliminary read.   ASSESSMENT AND PLAN: A 64 year old male with recent hospitalization for malignant hypertension, presents with generalized weakness, constipation also, lightheadedness with position changes, with objective findings of leukocytosis and possible urinary tract infection on urinalysis.    1. Generalized weakness: The etiology of this is unclear, could be as a result of deconditioning versus urinary tract infection. We will check urine cultures and initiate empiric antibiotics, check blood cultures given the leukocytosis although the patient is afebrile at this time. We will adjust antibiotics based on urine culture speciation. We will consult Physical Therapy as well as Case Management as the patient will likely need placement at a skilled nursing facility for more aggressive strengthening therapy.  2. Light-headedness: We will rule out orthostatic hypotension and evaluate this. The patient has received normal saline in the Emergency Department. This may be as a result of underlying infection, so we will treat empirically for urinary tract infection.  3. Leukocytosis: Etiology is unclear, might be as a result of urinary tract infection. Of note, review of records shows that leukocytosis was present even at the time of discharge, although it is slightly increased today again. We will check urine cultures, initiate antibiotics and monitor. No pulmonary infiltrates are currently identified.  4. Constipation: We will initiate aggressive bowel regimen.  5. Hypertension: Well controlled at this time. We will continue his home regimen, clonidine, hydralazine.  6. Hyperlipidemia: Continue Pravachol. 7. Diabetes: Continue aspirin, glipizide.  8. Urinary retention: Continue Hytrin.  9. Chronic kidney disease, stage 3, stable.  10. Prophylaxis: Lovenox.  11. The patient is being admitted to observation status.  12. Emesis: Pancreatitis has been ruled out and cholestatic disease has been  ruled out as well.  It is unclear the etiology of this. We will check abdominal flat plate to rule out ileus or bowel obstruction, particularly given the ongoing constipation.   CODE STATUS:  FULL CODE.     TIME SPENT ON ADMISSION: 50 minutes.   ____________________________ Aurther Loft,  DO aeo:cbb D: 02/25/2012 02:07:33 ET T: 02/25/2012 08:02:13 ET JOB#: [id number]  cc: Aurther Loft, DO, <Dictator> Rosi Secrist E Jaelon Gatley DO ELECTRONICALLY SIGNED 03/05/2012 0:33

## 2014-08-10 NOTE — Consult Note (Signed)
PATIENT NAME:  [name], [name] MR#:  [id number] DATE OF BIRTH:  01/16/1951  DATE OF CONSULTATION:  03/28/2012  REFERRING PHYSICIAN:  Dr. Nemiah Commander  CONSULTING PHYSICIAN:  Hemang K. Sherryll Burger, MD  REASON FOR CONSULTATION: Intractable nausea and new stroke.   HISTORY OF PRESENT ILLNESS: Mr. Sinclair is a 64 year old Caucasian gentleman with multiple long-standing uncontrolled vascular risk factors. He had his first stroke back in September 2011 which caused him to have a weakness on his right arm and leg.   He had another stroke in November 2013 when he started having significant nausea and had questionable worsening on the right side.   He had a third stroke at the onset of this admission around 03/18/2012 when he had some left-sided weakness.   Patient mentioned that his nausea has been never gotten back to normal since his second stroke.   On review of his MRI he does have extensive white matter changes as well as old lacunar infarcts but on his early November 2013 MRI he does have involvement of floor of the fourth ventricle close to the upper medulla and dentate nucleus on the left side as well as a separate left pontine lesion.   PAST MEDICAL HISTORY:  1. Recurrent stroke.  2. Hypertension.  3. Diabetes mellitus.  4. Diabetic retinopathy. 5. Chronic renal insufficiency. 6. Blind in his left eye.   PAST SURGICAL HISTORY: Negative.   MEDICATIONS/ALLERGIES: I reviewed his home medication list and allergies.   SOCIAL HISTORY: His social history is significant that he lives at home. 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 He denies any alcohol or drug abuse.   FAMILY HISTORY: Significant for hypertension and diabetes.   REVIEW OF SYSTEMS: Positive for blindness in his left eye, right-sided weakness from the stroke, left-sided weakness from the new stroke and diabetic neuropathy type tingling and numbness in his feet and persistent nausea and vomiting.  PHYSICAL  EXAMINATION:  VITAL SIGNS: Temperature 97.9, pulse 76, respiratory rate 19, blood pressure 163/92, pulse oximetry 93 on 2 liters of oxygen. His most recent sugar was 253.   GENERAL: He is an elderly looking Caucasian gentleman, unkempt lying in the bed, not in acute distress.   He is blind from his left eye. He has disconjugate gaze. He has very minimal discoloration of the tips of his toes suggestive of ischemic lesions.   Patient seemed to be nauseated.   LUNGS: Clear to auscultation.   HEART: S1, S2 heart sounds. Carotid exam did not reveal any bruit.   NEUROLOGIC: He was alert. He was oriented. He followed two-step commands. He did take some time to respond to some of the questions. He does not seem to have any neurological neglect.   He had very minimal dysarthria.   Otherwise, his attention, concentration and memory seem to be appropriate for his age and medical condition.   On his cranial nerves he is blind from his left eye. His pupil is large, asymmetric and nonreactive. His right eye he has reactive pupil.    His extraocular movements were disconjugate but were full. He had some saccadic pursuit.    His visual fields were difficult to check. He seems to have some scotomas even in his right visual field.   His face was symmetric. Tongue was midline. Facial sensations were intact. His hearing seems to be intact. He seems to have difficulty with the head thrust maneuver.   On his motor examination, he has a mildly increased tone  in his right side compared to the left side. His strength was 5/5 but he has positive pronator drift.   He has profound difficulty with prosody of the movement of his both upper and lower extremities.   He has incoordination with finger-to-nose and heel-to-shin.   His sensations were intact to light touch except decreased in his glove and stocking fashion.   I did not check his gait.   LABORATORY, DIAGNOSTIC, AND RADIOLOGICAL DATA: He has extensive  white matter changes with lacunar infarcts as described below.   ASSESSMENT AND PLAN:  1. Initial reason for consultation was intractable nausea and vomiting.   Patient had a stroke in early November 2013 which involved his floor of the fourth ventricle more to his left with separate pontine infarct.   The chemo-receptor trigger zone is in that region of floor of fourth ventricle area postrema.   His persistent nausea and vomiting can happen as a central lesion.   Time might be the best thing that help him but we can give him a trial of Tigan which is typically given in patients with Parkinson's disease with Sinemet-related nausea.   It is three times a day dose.    Patient can continue to take his other medications for intractable nausea control.   2. Recurrent stroke. His most recent stroke is in his right posterior limb of internal capsule and dorsal aspect of his thalamus causing left-sided weakness which might have been perceived as generalized weakness as he already has some right-sided upper and lower extremity weakness from his previous stroke.   Unfortunately, patient has long-standing uncontrolled primary risk factors as well as rough social situation.   I do not think he has anything different than extensive small vessel vascular disease.   I believe Aggrenox still gives him the best chance of secondary stroke prophylaxis.   I do not think he is a candidate for anticoagulation. We should also continue his statin and good antihypertensive controlled.   Patient's case is complicated with chronic kidney disease, etc.   Feel free to contact me with any further questions. I will see this patient on a p.r.n. basis in the hospital.   ____________________________ Hemang K. Sherryll BurgerShah, MD hks:cms D: 03/31/2012 09:08:00 ET T: 03/31/2012 09:20:08 ET JOB#: [id number]  cc: Hemang K. Sherryll BurgerShah, MD, <Dictator> Durene CalHEMANG K [facility] Peak[facility] MD ELECTRONICALLY SIGNED 04/10/2012 7:08

## 2014-08-10 NOTE — Consult Note (Signed)
Chief Complaint:   Subjective/Chief Complaint seen for nausea and vomiting-one episode of small volume emesis since yesterday.  denies abdominal pain.   tolerating soem regular diet, though he states his appetite is  not good.   VITAL SIGNS/ANCILLARY NOTES: **Vital Signs.:   06-Dec-13 14:22   Vital Signs Type Routine   Temperature Temperature (F) 97.9   Celsius 36.6   Temperature Source Oral   Pulse Pulse 76   Respirations Respirations 19   Systolic BP Systolic BP 301   Diastolic BP (mmHg) Diastolic BP (mmHg) 92   Mean BP 115   Pulse Ox % Pulse Ox % 93   Pulse Ox Activity Level  At rest   Oxygen Delivery 2L; Nasal Cannula   Brief Assessment:   Cardiac Regular    Respiratory clear BS    Gastrointestinal details normal Soft  Nontender  Nondistended  No masses palpable  Bowel sounds normal   Lab Results: Routine Chem:  06-Dec-13 04:18    Glucose, Serum  175   BUN  50   Creatinine (comp)  2.92   Sodium, Serum 138   Potassium, Serum 3.6   Chloride, Serum 101   CO2, Serum 30   Calcium (Total), Serum 8.9   Anion Gap 7   Osmolality (calc) 293   eGFR (African American)  26   eGFR (Non-African American)  22 (eGFR values <71m/min/1.73 m2 may be an indication of chronic kidney disease (CKD). Calculated eGFR is useful in patients with stable renal function. The eGFR calculation will not be reliable in acutely ill patients when serum creatinine is changing rapidly. It is not useful in  patients on dialysis. The eGFR calculation may not be applicable to patients at the low and high extremes of body sizes, pregnant women, and vegetarians.)   Assessment/Plan:  Assessment/Plan:   Assessment 1) n/v-multiple factors, however most likely central from CVA.  stable, some improvement since admission.    Plan 1) MRI result noted. Continue antiemetics/ppi as you are, add scheduled zofran dosing. I will not be available over the weeekend. Dr  EVira Agarcovering if needed.   Electronic  Signatures: SLoistine Simas(MD)  (Signed 06-Dec-13 17:45)  Authored: Chief Complaint, VITAL SIGNS/ANCILLARY NOTES, Brief Assessment, Lab Results, Assessment/Plan   Last Updated: 06-Dec-13 17:45 by SLoistine Simas(MD)

## 2014-08-10 NOTE — Consult Note (Signed)
Brief Consult Note: Diagnosis: NV, generalized weakness.   Patient was seen by consultant.   Consult note dictated.   Comments: Appreciate consult for 64 y/o caucasian man admitted with NV, weakness for eval of NV. Noted recent CVA last Month with resultant rt sided weakness, history of DM, htn, CKD. States that nuasea has been an ongoing problem for several weeks. States that it is triggered and worsens with position changes. States that vomiting improves the nausea.Also feels better when he is laying flat. States emesis is yellow.  States some times he has occasional dizziness and vertigo, and has been somewhat drowsy lately.  No HAs. States food does not make him naseous and denies abdominal pain, and further GI complaints, although does not want to eat due to nausea.Some improvement on antiemetics: has Reglan IV q8h, prn zofran/phenergam, and pantoprazole. Did have negative gastric emptying test since admission. Head CT on admission negative for acute findings.  Only 2 episodes of vomiting today. Does not know when his last A1C was checked. Can't remember who his nephrologist is. Impression and plan: Nausea, vomiting. Likely multifactorial. DM and CKD can contribute. Also with recent CVA  since it is triggered by position changes, am not sure of GI origin. Agree with present anti-emetics, although may want to schedule the Zofran as well This may have a more central cause.Further recommendations to follow. Addendum: in further chart review, patient has been admitted with uncontrolled diabetes, CKD, and uncontrolled hypertension. His A1c was as high as 12 in October and underwent renal consultation for hospitalizations in Oct and Nov. He was to fu as o/p. 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  Electronic Signatures: Vevelyn PatLondon, Oleta Gunnoe H (NP)  (Signed 04-Dec-13 16:45)  Authored: Brief Consult Note   Last  Updated: 04-Dec-13 16:45 by Keturah BarreLondon, Dakarri Kessinger H (NP)

## 2014-08-10 NOTE — Consult Note (Signed)
Chief Complaint:   Subjective/Chief Complaint less nausea today, one episode of emesis recorded since yesterday.  denies abdominal pain.   VITAL SIGNS/ANCILLARY NOTES: **Vital Signs.:   05-Dec-13 12:11   Vital Signs Type Routine   Temperature Temperature (F) 97.1   Celsius 36.1   Temperature Source oral   Pulse Pulse 73   Respirations Respirations 16   Systolic BP Systolic BP 146   Diastolic BP (mmHg) Diastolic BP (mmHg) 81   Mean BP 102   Pulse Ox % Pulse Ox % 90   Pulse Ox Activity Level  At rest   Oxygen Delivery 2L  *Intake and Output.:   Daily 05-Dec-13 07:00   Emesis ml     Out:  100   Brief Assessment:   Cardiac Regular    Respiratory clear BS    Gastrointestinal details normal Soft  Nontender  Nondistended  No masses palpable  Bowel sounds normal   Assessment/Plan:  Assessment/Plan:   Assessment 1) nausea, vomiting-multifactorial-recent pontine cva, dm, ckd.  some improvement with atd zofran.    Plan 1) may consider trial of tigan.  following.   Electronic Signatures: Barnetta ChapelSkulskie, Junelle Hashemi (MD)  (Signed 05-Dec-13 17:57)  Authored: Chief Complaint, VITAL SIGNS/ANCILLARY NOTES, Brief Assessment, Assessment/Plan   Last Updated: 05-Dec-13 17:57 by Barnetta ChapelSkulskie, Tarrin Lebow (MD)

## 2014-08-10 NOTE — Consult Note (Signed)
Pt with N and V after CVA, creat 2.9, BUN 50, on antiemetics and PPI.  Doing well, ate breakfast this am, no abd pain.  Moves both hads and good grip. I will not see tomorrow unless you call about a problem  Electronic Signatures: Scot JunElliott, Robert T (MD)  (Signed on 07-Dec-13 09:34)  Authored  Last Updated: 07-Dec-13 09:34 by Scot JunElliott, Robert T (MD)

## 2014-08-10 NOTE — H&P (Signed)
PATIENT NAME:  [name], RHETT MR#:  [id number] DATE OF BIRTH:  [birth year]  DATE OF ADMISSION:  03/18/2012  PRIMARY CARE PHYSICIAN: Nonlocal M.D.   CHIEF COMPLAINT: Nausea, vomiting and generalized weakness.   HISTORY OF PRESENT ILLNESS: Patient is a 64 year old Caucasian male with a past medical history of hypertension, diabetes mellitus, diabetic retinopathy, recent history of stroke on 02/25/2012 admission, chronic kidney disease stage III and old history of stroke two years ago is presenting to the ER with a chief complaint of nausea, vomiting followed by generalized weakness and unable to ambulate. During previous admission patient was diagnosed with acute stroke and was discharged home with home PT and home health as patient is self pay and cannot afford inpatient rehabilitation. His family members are assisting him and he was getting home physical therapy and home health. Patient was doing okay until yesterday when he started vomiting. Patient vomited yesterday yellow colored stuff without any blood. Following that he started feeling extremely weak. Patient was unable to get out of the bed. He had another 2 or 3 episodes of vomiting following which he became weak and eventually two people need to assist him to get out of the bed. Family members were concerned and they brought him into the ER. Initial CAT scan of the head did not show any acute changes. Hospitalist team is called to admit the patient for acute gastritis associated with generalized weakness and unable to ambulate. During my examination patient denies any chest pain or shortness of breath. Denies any difficulty in swallowing or dysarthria. No other complaints. Family members, patient's son and daughter, were at bedside.   PAST MEDICAL HISTORY:  1. Recent history of acute stroke during first week of November with right-sided hemiparesis.  2. Hypertension. 3. Diabetes mellitus. 4. Diabetic retinopathy. 5. Old history of stroke two  years ago. 6. Chronic renal insufficiency stage III.   PAST SURGICAL HISTORY: No surgeries.   ALLERGIES: No known drug allergies.   HOME MEDICATIONS:  1. Prevacid 20 mg p.o. at bedtime.  2. Pantoprazole 40 mg 1 tablet p.o. once daily. 3. Metoprolol tartrate 50 mg q.12 hours. 4. Hytrin 2 mg once a day. 5. Hydralazine 100 mg 3 times a day. 7. Clonidine 0.2 mg 3 times a day. 8. Aspirin-dipyridamole 1 capsule p.o. twice a day.  9. Timolol 0.5% ophthalmic 1 drop left eye.  10. Promethazine 25 mg half tablet p.o. once a day.   PSYCHOSOCIAL HISTORY: Lives at home. 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 Currently family members are assisting the patient. Denies alcohol or illicit drug usage. No history of tobacco.   FAMILY HISTORY: Hypertension and diabetes mellitus runs in his family.  REVIEW OF SYSTEMS: CONSTITUTIONAL: Patient denies any fever. Admits fatigue and tiredness associated with some leg weakness. He denies any weight loss or weight gain. 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 No eye pain. ENT: Denies any tinnitus, ear pain, difficulty in swallowing, postnasal drip. RESPIRATORY: Denies cough, wheezing, hemoptysis. GASTROINTESTINAL: 2 to 3 episodes of vomiting. Denies any diarrhea, abdominal pain, melena or rectal bleeding. GENITOURINARY: Denies any dysuria, frequency. ENDOCRINE: Denies polyuria, polyphagia, polydipsia. PSYCHIATRIC: Denies any depression, obsessive-compulsive disorder, schizophrenic. INTEGUMENTARY: Denies any lesions, rashes. MUSCULOSKELETAL: Denies any myalgias, arthralgias.   PHYSICAL EXAMINATION:  VITAL SIGNS: Temperature 97.9, pulse 62, respiratory rate 18, blood pressure 179/91.   GENERAL APPEARANCE: Not in acute  distress. Answering questions appropriately.  HEENT: Atraumatic. Left eye is blind. Right eye is with poor vision. No postnasal drip. Dry mucous  membranes.   NECK: Supple. No JVD.   LUNGS: Clear to auscultation bilaterally.   CARDIOVASCULAR: S1, S2 normal. Regular rate and rhythm.   GASTROINTESTINAL: Soft. Bowel sounds are positive in all four quadrants. Nontender, nondistended.   NEUROLOGIC: Awake, alert, and oriented x3. Right-sided weakness and recent history of stroke. Otherwise cranial nerves are grossly intact. Reflexes are 2+.   EXTREMITIES: No edema. No cyanosis. No clubbing.   SKIN: No lesions and no rashes.   LABORATORY, DIAGNOSTIC AND RADIOLOGICAL DATA: CAT scan of the head showed no acute findings. Glucose 174, sodium 137, potassium 4.0, chloride 107, CO2 22, GFR 27, anion gap 8,  calcium 8.6, BUN 32, glucose serum 174, creatinine 2.72, total protein 7.9, albumin 2.8, bilirubin total 0.3, alkaline phosphatase 86, CK total 53, CPK-MB 0.7, troponin T less than 0.02. WBC 10.7, hemoglobin 10.5, hematocrit 31.9, platelet count 309,000. Urinalysis negative nitrite, negative leukocyte esterase. .   ASSESSMENT AND PLAN : 64 year old patient presenting to the ER with a chief complaint of nausea, vomiting, generalized weakness, difficulty in ambulation will be admitted with the following:   1. Acute gastritis. Admit to med/surge floor. Will start the patient on Protonix and Zofran as needed basis for nausea and vomiting.  2. Generalized weakness and difficulty ambulation probably from dehydration from problem #1.   3. Hypertension. Blood pressure is elevated. Resume his home medications and titrate as needed.  4. Acute on chronic renal insufficiency, stage III. It looks like baseline is at 2.0. At the time of admission creatinine is at 2.72. Plan is to provide him gentle hydration IV fluids and monitor renal function closely. Avoid nephrotoxins.  5. Recent history of stroke with old history of another stroke two years ago. Continue Aggrenox and statin.  6. Diabetes mellitus, type 2. Will resume his home medication glipizide and will  put him on sliding scale insulin as well.  7. Generalized weakness with difficulty in ambulation. Will put a physical therapy consult.  8. Will consult case management regarding discharge planning versus placement.   The diagnosis and plan of care was discussed in detail with the patient and his family members at bedside. They all verbalized understanding of the plan.   TOTAL TIME SPENT ON THE ADMISSION: 50 minutes.   ____________________________ Ramonita LabAruna Yamil Oelke, MD ag:cms D: 03/18/2012 00:48:46 ET T: 03/18/2012 07:44:22 ET JOB#: [id number]  cc: Ramonita LabAruna Vyncent Overby, MD, <Dictator> Ramonita LabARUNA Dorsey Authement MD ELECTRONICALLY SIGNED 03/19/2012 6:23

## 2014-08-10 NOTE — Consult Note (Signed)
PATIENT NAME:  [name], [place] MR#:  [id number] DATE OF BIRTH:  [birth year]  DATE OF CONSULTATION:  03/26/2012  REFERRING PHYSICIAN:   CONSULTING PHYSICIAN:  Keturah Barre, NP  HISTORY OF PRESENT ILLNESS: Mr. Susman is a pleasant 64 year old Caucasian man admitted originally for nausea, vomiting, and generalized weakness. GI has been consulted by Dr. Luberta Mutter to evaluate his nausea and vomiting. Noted recent cerebrovascular accident last month with resultant right-sided weakness, history of diabetes, hypertension, chronic kidney disease, has actually been admitted quite a few times over the last few months with uncontrolled diabetes, chronic kidney disease, uncontrolled hypertension. His A1c was as high as 12 in October he underwent renal consultation for hospitalizations in October and November. He was to follow up as outpatient. He also underwent left-sided infarct to his pons per MRI. This involves the vertebrobasilar system. Patient states this nausea has been an ongoing problem for several weeks. States that it is triggered and worsens with position changes. States that vomiting improves the nausea. Also feels better when he is laying flat. States his emesis is yellow. States sometimes he has occasional dizziness and vertigo and has been somewhat drowsy lately. No headaches. States food does not make him nauseous and denies abdominal pain and further GI complaints, although does not want to eat due to nausea. Some improvement on antiemetics, has Reglan IV q.8 hours, p.r.n. Zofran, Phenergan and pantoprazole IV b.i.d. Did have negative gastric emptying test since admission. Head CT on admission negative for acute findings. Only two episodes of vomiting today. Does not know when his last A1c was checked. Cannot remember who his nephrologist is.  PAST MEDICAL HISTORY:  1. Acute stroke 02/2012 with right-sided hemiparesis. 2. Hypertension. 3. Diabetes mellitus. 4. Diabetic  retinopathy. 5. Cerebrovascular accident two years ago. 6. Chronic renal insufficiency, stage III.   ALLERGIES: No known drug allergies.   HOME MEDICATIONS:  1. Prevacid 20 mg p.o. at bedtime.  2. Pantoprazole 40 mg p.o. daily.  3. Metoprolol 50 mg p.o. q.12 hours.  4. Hytrin 2 mg p.o. daily.  5. Hydralazine 100 mg p.o. t.i.d.  6. Clonidine 0.2 mg p.o. t.i.d.  7. Aggrenox one cap p.o. b.i.d. 8. Timolol ophthalmic one drop left eye.  9. Promethazine 25 mg half tab p.o. daily.   SOCIAL HISTORY: Currently lives at home. Family members are assisting patient. No alcohol, tobacco, illicits. 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   FAMILY HISTORY: Significant for diabetes, hypertension.   REVIEW OF SYSTEMS: Does have history significant for blindness in left eye due to retinopathy, otherwise ten-point review is negative aside what is written above.   LABORATORY, DIAGNOSTIC AND RADIOLOGICAL DATA: Most recent lab work: Glucose 125, BUN 43, creatinine 2.82, sodium 137, potassium 3.7, GFR 23, calcium 9.1, WBC 9.1, hemoglobin 10.6, hematocrit 29.6, platelet count 239, red cells are normocytic, albumin 2.8 11/25 otherwise his hepatic panel is in normal limits. He has had a normal gastric emptying study.   PHYSICAL EXAMINATION:  VITAL SIGNS: Most recent vital signs: Temperature 98.3, heart rate 82, pulse 18, blood pressure 164/88, oxygen saturation 90% on 2 liters.   GENERAL: Somewhat drowsy, has had Phenergan dose recently, no acute distress, appears comfortable lying flat.   HEENT: Normocephalic, atraumatic. No redness, drainage, or inflammation to the eyes or the nares. Oral mucous membranes are pink and moist.   NECK: Supple. No JVD, lymphadenopathy, thyromegaly.   CHEST: Respirations eupneic. Lungs CTAB.   CARDIOVASCULAR: S1, S2, regular rate and rhythm. No MRG. No  appreciable edema.   GASTROINTESTINAL: Flat abdomen. Bowel sounds x4, very soft, nontender, nondistended. No  hepatosplenomegaly, masses, peritoneal signs, rebound tenderness, guarding, rigidity.   RECTAL: Deferred.   GENITOURINARY: Deferred.   NEUROLOGIC: Alert, oriented x3. Does have right-sided weakness to his right arm and right leg. Right pupil is 3 mm, round, reacts briskly. Left pupil is 5 mm, somewhat sluggish. Patient does have history of blindness and states irregular pupil that this is not a change. Speech somewhat dysarthric, however, patient is also missing several teeth. Follow commands. Does move all extremities. Sensation appears to be intact. No appreciable facial droop.   SKIN: Warm, dry, pink. No erythema, lesion or rash.   EXTREMITIES: No edema, cyanosis or clubbing.   IMPRESSION AND PLAN: Nausea, vomiting, likely multifactorial with diabetes, chronic kidney disease and pontine lesion which involves the vertebrobasilar system. Nausea, vomiting is seen with infarcts in this area. Additionally, since it is triggered by position changes not convinced this is of GI origin. Agree with present antiemetics although may want to schedule Zofran as well. This nausea may have more central cause. Further recommendations to follow. Thank you for this consult.   These services were provided by Vevelyn Pathristiane Bertil Brickey, MSN, NPC in collaboration with Christena DeemMartin U. Skulskie, M.D. with whom I have discussed this patient in full.  ____________________________ Keturah Barrehristiane H. Keelie Zemanek, NP chl:cms D: 03/27/2012 08:03:00 ET T: 03/27/2012 08:40:59 ET JOB#: [id number]  cc: Keturah Barrehristiane H. Desman Polak, NP, <Dictator> Eustaquio MaizeHRISTIANE H Norena Bratton FNP ELECTRONICALLY SIGNED 04/02/2012 10:58

## 2014-08-10 NOTE — Discharge Summary (Signed)
PATIENT NAME:  Travis FitchCORNELL, Holmes A MR#:  [id number] DATE OF BIRTH:  October 30, 1950  DATE OF ADMISSION:  02/27/2012 DATE OF DISCHARGE:  03/03/2012  PRIMARY CARE PHYSICIAN: Nonlocal CONSULTING PHYSICIAN: Dr. Thedore MinsSingh  DISCHARGE DIAGNOSES:  1. Acute cerebrovascular accident.  2. Hypertension malignancy.  3. Acute renal failure on chronic kidney disease.  4. Hypernatremia.  5. Urinary tract infection.  6. Orthostatic hypotension.  7. Anemia.  8. Diabetes.   CODE STATUS: FULL CODE.   CONDITION: Stable.   HOME MEDICATIONS: [facility]. Patient needs home health and physical therapy.   DIET: Low sodium, low fat, low cholesterol, ADA diet.   ACTIVITY: As tolerated.   FOLLOW-UP CARE: Follow up with PCP within 1 to 2 weeks. Follow up with Dr. Thedore MinsSingh within one week   REASON FOR ADMISSION: Generalized weakness and emesis.   HOSPITAL COURSE: Patient is a 64 year old Caucasian male with a history of hypertension, diabetes, history of cerebrovascular accident, chronic kidney disease stage III presented to ED with generalized weakness and one episode of emesis. Patient has had difficulty walking and unable to move his legs. For detailed history and physical examination, please refer to the admission note dictated by Dr. Marc Morganskafor. On admission date patient's white count was 15,000, hemoglobin 11.2, BUN 21, creatinine 1.88. Chest x-ray no acute infiltrate. Urinalysis shows WBC 19, nitrates negative. The patient was in the generalized weakness unknown etiology, possibly due to result of deconditioning with urinary tract infection. Patient's MRI showed acute infarct in left pons likely thrombotic. Patient's aspirin was changed to Aggrenox p.o. b.i.d. In addition patient has been treated with statin. Patient was seen in evaluation for physical therapy who suggested patient needs subacute rehab, however, due to insurance issue patient cannot go to rehab. Patient will be discharged to  home with home health and home PT.   For urinary tract infection, patient has been treated with Rocephin. Urine culture showed candidus. Patient was treated with Diflucan.   Patient also has hypertension malignancy, which has been treated with hypertension medication. Blood pressure has been controlled under 160.   Acute renal failure on chronic kidney disease. Patient's creatinine increased to 3.44, so we requested nephrology consult and give IV fluid support. Creatinine decreased to 3.18. Dr. Thedore MinsSingh suggested follow up as outpatient.   Patient still has weakness, need a physical therapy and home health. Patient is clinically stable, will be discharged to home with home health and PT today.   Discussed the patient's situation with the patient, the patient's family member, case Production designer, theatre/television/filmmanager and nurse.   TIME SPENT: About 45 minutes.  ____________________________ Shaune PollackQing Berenis Corter, MD qc:cms D: 03/03/2012 17:28:31 ET T: 03/04/2012 12:54:14 ET JOB#: [id number]  cc: Shaune PollackQing Katheryn Culliton, MD, <Dictator> Shaune PollackQING Jennie Bolar MD ELECTRONICALLY SIGNED 03/04/2012 16:45

## 2014-08-10 NOTE — Consult Note (Signed)
Chief Complaint:   Subjective/Chief Complaint Please see full GI consult and brief consult note.  Patient admitted with n/v and weakness, in the setting of revent (02/25/12) Pontine CVA.  Patient states he had no problems with n/v prior to this last stroke.  He related increased dizziness and sx  that are positional.  Currently on ppi and phenergan/reglan/zofran.  Negative gastric emptying study.  Likely multifactorial/central, Pontine CVA, DM, ckd.  Will trial atc zofran.  Will check h. pylori serology.  Following.   Electronic Signatures: Barnetta ChapelSkulskie, Jalissa Heinzelman (MD)  (Signed 04-Dec-13 19:35)  Authored: Chief Complaint   Last Updated: 04-Dec-13 19:35 by Barnetta ChapelSkulskie, Temperance Kelemen (MD)

## 2014-08-10 NOTE — Consult Note (Signed)
VSS afebrile, on 2L at 94%, ate all of breakfast, grip in both hands is good.  No vomiting.  I will sign off, reconsult if needed.  Electronic Signatures: Scot JunElliott, Dearion Huot T (MD)  (Signed on 08-Dec-13 10:11)  Authored  Last Updated: 08-Dec-13 10:11 by Scot JunElliott, Roald Lukacs T (MD)

## 2014-08-10 NOTE — Discharge Summary (Signed)
PATIENT NAME:  [name], [name] MR#:  [id number] DATE OF BIRTH:  07/16/1950  DATE OF ADMISSION:  02/15/2012 DATE OF DISCHARGE:  02/21/2012  HISTORY: For a detailed note, please take a look at the history and physical done on admission by Dr. Vianne Bulls.   DIAGNOSES AT DISCHARGE:  1. 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 2. Chronic kidney disease, stage III. 3. Uncontrolled diabetes. 4. Hyperlipidemia. 5. Urinary retention.   DIET: The patient is being discharged on a low-sodium, low-fat, American Diabetic Association diet.   ACTIVITY: As tolerated.   FOLLOW-UP:  1. [facility].  2. The patient also has been referred to Dr. Juleen China from nephrology and also to [facility].  DISCHARGE MEDICATIONS: 1. Brimonidine 0.2% ophthalmic drops one drop t.i.d. to the left eye. 2. Atropine one drop to the left eye.  3. Timolol one drop to the left eye daily.  4. Clonidine 0.2 mg b.i.d.  5. Glipizide 10 mg b.i.d.  6. Pravachol 20 mg daily.  7. Aspirin 81 mg daily.  8. Metoprolol tartrate 50 mg b.i.d.  9. Hydralazine 100 milligrams t.i.d.  10. Hytrin 2 mg at bedtime.   CONSULTANT DURING THE HOSPITAL COURSE: Dr. Anthonette Legato from nephrology.   PERTINENT STUDIES DURING HOSPITAL COURSE: CT scan of the head done without contrast on admission showing no acute abnormality. Chest x-ray showed minimal perihilar and left lower lobe atelectasis. An ultrasound of the kidneys showing likely benign cysts within the right kidney. Consider mural calcification.   HOSPITAL COURSE: This is a 64 year old male with medical problems as mentioned above who presented to the hospital secondary to nausea, vomiting and significantly elevated blood pressures and noted to have malignant hypertension with systolic blood pressures over 824M, diastolic over 353I.  1. Malignant hypertension. This was likely secondary to noncompliance. The patient had not taken any medications for over a year. He was admitted  to the Intensive Care Unit first on a nitroglycerin drip. He was started on some low dose beta blockers. He needed several changes to his antihypertensive medications and eventually we were able to get better control. He was eventually weaned off nitro drip. Currently, he is being discharged on clonidine, metoprolol, and hydralazine. His blood pressure has significantly improved and now normalized.  2. Uncontrolled diabetes. The patient again was not taking any meds prior to coming in. His hemoglobin A1c was noted to be as high as 12, although I was able to get better control of his diabetes with just p.o. meds as he could not afford insulin. He was discharged on some high dose glipizide. I could not start him on metformin given his chronic kidney disease. If his sugars still continue to be uncontrolled on p.o. glipizide, he may need to be started on insulin as an outpatient.  3. Chronic kidney disease, stage III. This is likely secondary to uncontrolled diabetes and hypertension. A nephrology consult was obtained. They initiated a serologic work-up and they will continue to follow the patient as an outpatient. He had a serologic work-up including anti-GBM which was negative, his ANA which was also negative. His ANCA panel was also negative. His renal ultrasound only showed a benign cyst which can be further followed up as an outpatient. His SPEP was also negative.  4. Urinary retention. The patient did have some urinary retention. He was started on some Flomax and a Foley catheter was placed. The Foley catheter was removed two days after it was inserted and he was able to void well after being  initiated on Flomax. Since he could not afford Flomax, I put him on some Hytrin prior to being discharged. He was also referred to [facility] as an outpatient. The patient is being discharged home with home health nursing and physical therapy services.      TIME SPENT WITH DISCHARGE: 40 minutes.    ____________________________ Belia Heman. Verdell Carmine, MD vjs:ap D: 02/21/2012 16:18:57 ET T: 02/22/2012 08:40:45 ET JOB#: [id number]  cc: Belia Heman. Verdell Carmine, MD, <Dictator> Mamie Levers, MD [facility]  Henreitta Leber MD ELECTRONICALLY SIGNED 02/28/2012 12:56

## 2014-08-10 NOTE — Consult Note (Signed)
Brief Consult Note: Diagnosis: recurrent strokes, intractable nausea.   Patient was seen by consultant.   Consult note dictated.   Comments: - Patient has had multiple strokes by imging. - first clinical stroke in 12/2009 causing right sided weakness, 2nd in 02/2012 - left pontine causing worsening of right sided weakness. 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  - will follow less frequently.  Electronic Signatures: Jolene ProvostShah, Lain Tetterton Kalpeshkumar (MD)  (Signed 07-Dec-13 11:11)  Authored: Brief Consult Note   Last Updated: 07-Dec-13 11:11 by Jolene ProvostShah, Alaa Mullally Kalpeshkumar (MD)

## 2014-08-10 NOTE — H&P (Signed)
PATIENT NAME:  [name], Travis A MR#:  [id number] DATE OF BIRTH:  [birth year]  DATE OF ADMISSION:  02/15/2012  PRIMARY CARE PHYSICIAN:  None.  ER DOCTOR: Dr. Carollee MassedKaminski.   CHIEF COMPLAINT: Nausea and vomiting.   HISTORY OF PRESENT ILLNESS: The patient is a 64 year old male with hypertension, diabetes, not taking medication for the last six months came in because of nausea and vomiting for the last three days. The patient's blood pressure was 216/130 when he came. The patient has been having nausea and vomiting, unable to keep anything down for the past three days. This morning he complained of left quadrant abdominal pain which is resolved now and felt like feverish sensation this morning, but no diarrhea and no recent sick contacts. The patient has no chest pain. No trouble breathing. Has headache this morning and the patient is not taking medicine for his diabetes or high blood pressure for the last six months and has no primary doctor. Vomiting mainly of bilious stuff and clear liquid.   PAST MEDICAL HISTORY:   1. Hypertension.  2. Diabetes.  3. Diabetic retinopathy.  4. History of stroke last year.   ALLERGIES: No known allergies.   SOCIAL HISTORY: The patient has no smoking, no drinking. No drugs.   FAMILY HISTORY: Family history of hypertension and diabetes in dad and also sister. The patient also denies any chronic kidney disease.   PAST SURGICAL HISTORY: No surgeries.   MEDICATIONS: Not taking any medications.   REVIEW OF SYSTEMS: CONSTITUTIONAL: Feels fatigue. EYES: Has poor vision in both eyes. Vision affected because of diabetes. ENT: No tinnitus. No epistaxis. No difficulty swallowing. RESPIRATORY: Denies any cough. CARDIOVASCULAR: No chest pain. No orthopnea. GASTROINTESTINAL: Has nausea and vomiting for three days, unable to keep anything down and the patient denies any diarrhea. GENITOURINARY: No dysuria. ENDOCRINE: Has diabetes. Blood sugar is 300 here. INTEGUMENTARY: No skin  rashes. MUSCULOSKELETAL: Complains of headache today. NEUROLOGIC: The patient is weak on the right side because of previous stroke, but denies any dysarthria. The patient has headache. No history of seizures or memory problems. PSYCH: No anxiety or insomnia.   PHYSICAL EXAMINATION:  VITAL SIGNS: Temperature 98.5, pulse 93, respirations 20, blood pressure initially 216/130. During my visit blood pressure was 194/108 and pulse was 80.   HEENT: Head atraumatic, normocephalic. Pupils equally reacting to light. Extraocular movements intact. No conjunctivitis. Hearing is intact as I mentioned. Tympanic membranes are clear. No oropharyngeal erythema. Mucous membranes are dry.   NECK: No thyroid enlargement. No lymphadenopathy. No JVD.   RESPIRATORY: Clear to auscultation. No wheeze. No rales.   CARDIOVASCULAR: S1, S2, regular, tachycardic. PMI not displaced. Good pedal pulses. No extremity edema.   ABDOMEN: Soft. I do not see any tenderness. Bowel sounds present. No hepatosplenomegaly. No hernias.   MUSCULOSKELETAL: Slightly weak on the right upper and lower due to previous stroke, but left side strength is five out of five in both upper and lower. No cyanosis. No kyphosis.   SKIN: No skin rashes.   NEUROLOGIC: Cranial nerves II through XII intact. Deep tendon reflexes 2+ bilaterally. Sensations are intact. No dysarthria or aphasia and slightly weak on the right upper and lower due to previous stroke. No contractures.   PSYCH: Oriented to time, place, and person, alert.   LABORATORY DATA: Troponin 0.08. CK total 101. CPK is 1.3. WBC 13.1, hemoglobin 15.4, hematocrit 44.7, platelets 259. Electrolytes: Sodium 132, potassium 3.1, chloride 95, bicarbonate 23, BUN 28, creatinine 1.94, glucose 333. Glucose repeat was  346. CT of head showed no acute abnormality. No mass. No hydrocephalus. No hemorrhage. The patient has lucency in the posterior limb of internal capsule consistent with old stroke. WBC up at  13.1, glucose 346, the first one. EKG showed normal sinus rhythm. No ST-T changes, 81 beats per minute.   ASSESSMENT AND PLAN:  The patient is a 64 year old male with history of hypertension, diabetes, noncompliant with medications and history of previous stroke, who came in because of nausea, vomiting, headache, and elevated blood pressure.  1. Hypertensive urgency. The patient received 20 mg of labetalol two times but blood pressure is still elevated. We are going to admit him to Intensive Care Unit, start him on nitro drip. Because of his nausea and poor p.o. intake, we will give him nitro drip to control the blood pressure and also his troponin elevation is likely secondary to his uncontrolled blood pressure. Cycle the troponins. Continue aspirin, beta blockers. Get echocardiogram.  2. Acute renal failure with hypokalemia, hyponatremia. Anion gap is 14. The patient has acute renal failure due to nausea and vomiting for the last three days with poor p.o. intake. Continue IV fluids with potassium replacement. Recheck BMP. Avoid nephrotoxic agents. Avoid ACE inhibitors.  3. Diabetes mellitus type 2, uncontrolled. Blood sugar 300. Anion gap is 14. The patient will have insulin with sliding scale coverage and check hemoglobin A1c and likely start oral antibiotics once his nausea resolves and his p.o. intake is good.  4. Elevated white count, likely reactive with nausea and vomiting. The patient is empirically covered with Levaquin for now. Follow the urine cultures and blood cultures.  5. History of stroke before with residual right-sided weakness. Continue aspirin. Get fasting lipase. Physical therapy consult. Case manager consult for discharge planning with assistance with getting medications and also setting up with [facility].   TIME SPENT ON CRITICAL ADMISSION: About 60 minutes.    ____________________________ Katha Hamming, MD sk:ap D: 02/15/2012 15:24:30 ET T: 02/15/2012 16:43:20  ET JOB#: [id number] cc: Katha Hamming, MD, <Dictator> [facility] Katha Hamming MD ELECTRONICALLY SIGNED 03/04/2012 19:41

## 2014-08-13 NOTE — Discharge Summary (Signed)
PATIENT NAME:  [name], [name] DATE OF BIRTH:  [birth year]  DATE OF ADMISSION:  03/19/2012 DATE OF DISCHARGE:  04/02/2012  This is the final discharge summary in addition to the interim discharge summary dictated by Dr. Luberta MutterKonidena on 12/04.   DISCHARGE DIAGNOSES:  1. Intractable nausea and vomiting, appears central due to acute stroke in the area postrema, which is a chemoreceptor trigger zone.  2. History of recurrent strokes with right-sided hemiparesis.  3. Hypertension.  4. Type 2 diabetes.  5. Chronic kidney disease.  6. Diabetic retinopathy.  MEDICATIONS AT DISCHARGE:  1. Tigan 300 mg p.o. t.i.d.  2. Dulcolax 10 mg rectal suppository daily p.r.n.  3. Metoclopramide 10 mg q. 6.  4. Diazepam 2 mg q. 8 hours p.r.n. for vomiting and dizziness.  5. Metoprolol 50 mg q. 6 hours.  6. Colace 100 mg b.i.d.  7. Milk of Magnesia 30 mL b.i.d. p.r.n.  8. Polyethylene glycol 17 grams orally daily.  9. Protonix 40 mg daily.  10. Aggrenox 1 capsule b.i.d.  11. Pravachol 20 mg at bedtime.  12. Hydralazine 100 mg t.i.d.  13. Brimonidine 0.2% ophthalmic solution, one drop in both eyes q. 8 hours.  14. Timolol maleate 0.5% ophthalmic drops, one drop in both eyes b.i.d.  15. Sliding scale insulin.  16. Clonidine 0.2 mg b.i.d.  17. Terazosin 2 mg at bedtime.  18. Phenergan 12.5 mg per rectal suppository q.i.d. p.r.n. for dizziness. 19. Glipizide 10 mg b.i.d.   CONSULTATIONS:  1. Dr. Marva PandaSkulskie, GI. 2. Dr. Cristopher PeruHemang Shah, neurology.  3. Physical therapy.   LABS AT DISCHARGE: Platelet count 222, creatinine is 2.42. H. pylori stool antigen is negative. White count is 10.1, hemoglobin and hematocrit 10.2 and 30.2, platelet count 237. Sodium 134, potassium 4.3, chloride 100, bicarbonate is 28. MRI of the brain done on 12/06 again shows acute focal infarct in the upper aspect of the posterior limb right internal capsule, severe chronic white matter changes.   BRIEF SUMMARY OF HOSPITAL  COURSE: Please note I took care of the patient 12/09, 12/10, and 04/02/2012.  For other daily progress notes please review SCM.  Mr. [name] is a Caucasian gentleman with history of hypertension, diabetes, and history of stroke in the past who presented with:  1. Persistent nausea and vomiting: The patient had a normal gastric emptying study. He was continued on PPI. He received scheduled Tigan and Reglan and p.r.n. Zofran. GI consultations were done by Dr. Marva PandaSkulskie and Dr. Mechele CollinElliott along with consultation by Dr. Cristopher PeruHemang Shah of neurology and the patient's vomiting appears to be central due to his most recent strokes, multiple ones, and the recent one that is in the area postrema, which is chemoreceptor trigger zone. The patient was on Decadron, which has been discontinued. He is on scheduled Tigan and Reglan and p.r.n. Phenergan. He is tolerating a p.o. diet, however has had intermittent vomiting. For the last 24 hours he has not vomited. Curbside consultation with neurology was obtained at [facility] and recommends p.r.n. Valium.  2. Weakness with recent cerebrovascular accident:  Physical therapy has been initiated: The patient will go to [facility] for rehab.  3. Acute on chronic stage III kidney disease:  The patient is at baseline with creatinine around 1.9 to 2.4.  4. Acute cerebrovascular accident: Recurrent, on Aggrenox, tolerating well. He is continued on statins. The patient will need rehab for which he is going [facility].  5. Hypertension: Continue metoprolol  and hydralazine for now.  6. Type 2 diabetes: He will resume his p.o. glipizide and continue sliding scale insulin.   Hospital stay was prolonged. However, the patient at present is stable. He is a FULL CODE.      TIME SPENT: 40 minutes.   ____________________________ Wylie Hail Allena Katz, MD sap:bjt D: 04/02/2012 09:24:04 ET T: 04/02/2012 10:15:54 ET JOB#: [id number]  cc: Ranyia Witting A. Allena Katz, MD,  <Dictator> Willow Ora MD ELECTRONICALLY SIGNED 04/23/2012 10:33
